# Patient Record
Sex: Male | Born: 1990 | Race: White | Hispanic: No | Marital: Married | Smoking: Former smoker
Health system: Southern US, Community
[De-identification: ages and names within clinical notes are randomized; demographics above are authoritative.]

## PROBLEM LIST (undated history)

## (undated) ENCOUNTER — Emergency Department: Admission: EM | Payer: PRIVATE HEALTH INSURANCE

## (undated) DIAGNOSIS — E291 Testicular hypofunction: Secondary | ICD-10-CM

## (undated) DIAGNOSIS — R7989 Other specified abnormal findings of blood chemistry: Secondary | ICD-10-CM

## (undated) HISTORY — DX: Testicular hypofunction: E29.1

## (undated) HISTORY — PX: HERNIA REPAIR: SHX51

---
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
## 2012-09-15 ENCOUNTER — Encounter (HOSPITAL_COMMUNITY): Payer: Self-pay | Admitting: *Deleted

## 2012-09-15 ENCOUNTER — Emergency Department (HOSPITAL_COMMUNITY): Payer: Worker's Compensation

## 2012-09-15 ENCOUNTER — Emergency Department (HOSPITAL_COMMUNITY)
Admission: EM | Admit: 2012-09-15 | Discharge: 2012-09-15 | Disposition: A | Discharge status: Home / Self Care | Payer: Worker's Compensation | Attending: Emergency Medicine | Admitting: Emergency Medicine

## 2012-09-15 DIAGNOSIS — Y990 Civilian activity done for income or pay: Secondary | ICD-10-CM | POA: Insufficient documentation

## 2012-09-15 DIAGNOSIS — S61409A Unspecified open wound of unspecified hand, initial encounter: Secondary | ICD-10-CM | POA: Insufficient documentation

## 2012-09-15 DIAGNOSIS — F17200 Nicotine dependence, unspecified, uncomplicated: Secondary | ICD-10-CM | POA: Insufficient documentation

## 2012-09-15 DIAGNOSIS — Z23 Encounter for immunization: Secondary | ICD-10-CM | POA: Insufficient documentation

## 2012-09-15 DIAGNOSIS — Y9269 Other specified industrial and construction area as the place of occurrence of the external cause: Secondary | ICD-10-CM | POA: Insufficient documentation

## 2012-09-15 DIAGNOSIS — S61411A Laceration without foreign body of right hand, initial encounter: Secondary | ICD-10-CM

## 2012-09-15 DIAGNOSIS — W241XXA Contact with transmission devices, not elsewhere classified, initial encounter: Secondary | ICD-10-CM | POA: Insufficient documentation

## 2012-09-15 DIAGNOSIS — S6720XA Crushing injury of unspecified hand, initial encounter: Secondary | ICD-10-CM | POA: Insufficient documentation

## 2012-09-15 MED ORDER — HYDROCODONE-ACETAMINOPHEN 5-325 MG PO TABS: 1.0000 | ORAL_TABLET | ORAL | Status: DC | PRN

## 2012-09-15 MED ORDER — TETANUS-DIPHTH-ACELL PERTUSSIS 5-2.5-18.5 LF-MCG/0.5 IM SUSP
0.5000 mL | Freq: Once | INTRAMUSCULAR | Status: AC
  Administered 2012-09-15: 0.5 mL via INTRAMUSCULAR
  Filled 2012-09-15: qty 0.5

## 2012-09-15 NOTE — ED Notes (Signed)
Pt at this time is now reporting numbness again in his thumb. Patient still able to move all 5 digits on affected hand. Cap refill less than 3 seconds.

## 2012-09-15 NOTE — ED Notes (Signed)
pt with crush injury to right hand. reports hand getting caught in machine and going around twice on hand. pt with laceration to right posterior aspect of hand between pointer and thumb. tendon noted but intact. pt is able to move all fingers. Pt however notes numbness to thumb and and half of proximal aspect of pointer and middle finger. CMS intact to nail bed and cap refill less than 3.

## 2012-09-15 NOTE — ED Provider Notes (Signed)
History     CSN: [id number]  Arrival date & time 09/15/12  0154   First MD Initiated Contact with Patient 09/15/12 [phone]      Chief Complaint  Patient presents with  . Laceration   HPI  History provided by the patient. 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 He has a crush and laceration injury to the right hand. Pain is moderate to severe. Worse with movements or pressure. Patient also has associated numbness in the first digit. He reports reduced range of motion secondary to pain. Denies any other injuries. Pt reports being current on tetanus within the last 5 years. He has not used treatment for his symptoms.    History reviewed. No pertinent past medical history.  History reviewed. No pertinent past surgical history.  History reviewed. No pertinent family history.  History  Substance Use Topics  . Smoking status: Current Every Day Smoker  . Smokeless tobacco: Not on file  . Alcohol Use: Yes      Review of Systems  Musculoskeletal:       Laceration and pain in rt hand  Neurological: Positive for numbness. Negative for weakness.    Allergies  Review of patient's allergies indicates no known allergies.  Home Medications  No current outpatient prescriptions on file.  BP 125/75  Pulse 93  Temp 98.9 F (37.2 C)  Resp 17  SpO2 98%  Physical Exam  Nursing note and vitals reviewed. Constitutional: He appears well-developed and well-nourished.  HENT:  Head: Normocephalic.  Cardiovascular: Normal rate and regular rhythm.   Pulmonary/Chest: Effort normal and breath sounds normal. No respiratory distress. He has no wheezes. He has no rales.  Musculoskeletal:       5 cm laceration to dorsal right hand on radial side.  Wound explored through full ROM.  Wound does reach level of 2nd metacarpal bone.  No obvious tendon involvement. Normal strength of first. Pt does report feeling slight numbness  in first digit. He is able to sense light touch to medial and lateral aspects of all fingers. Normal cap refill less than 2 seconds.  Neurological: He is alert.  Skin: Skin is warm.  Psychiatric: He has a normal mood and affect. His behavior is normal.    ED Course  Procedures   LACERATION REPAIR Performed by: Angus Seller Authorized by: Angus Seller Consent: Verbal consent obtained. Risks and benefits: risks, benefits and alternatives were discussed Consent given by: patient Patient identity confirmed: provided demographic data Prepped and Draped in normal sterile fashion Wound explored  Laceration Location: Dorsal right hand  Laceration Length: 5 cm  No Foreign Bodies seen or palpated  Anesthesia: local infiltration  Local anesthetic: lidocaine 2% without epinephrine  Anesthetic total: 6 ml  Irrigation method: syringe Amount of cleaning: extensive  Skin closure: Skin with 4-0 Nylon  Number of sutures: 7  Technique: Simple interrupted.  Patient tolerance: Patient tolerated the procedure well with no immediate complications.    Dg Hand Complete Right  09/15/2012  *RADIOLOGY REPORT*  Clinical Data: Laceration, pain, question very.  RIGHT HAND - COMPLETE 3+ VIEW  Comparison: None.  Findings: There is a radiopaque density along the radial margin of the proximal aspect second metacarpal.  Correlate with their symptoms if concerned for small fracture or radiopaque foreign body. A donor site is not definitively identified.  Otherwise, no displaced fracture or dislocation identified.  No aggressive osseous lesion.  IMPRESSION: Small radiopaque density along the proximal radial margin of the second metacarpal.  This may reflect a small fracture fragment or radiopaque foreign body.   Original Report Authenticated By: Waneta Martins, M.D.      1. Crush injury to hand   2. Laceration of hand, right       MDM  Pt seen and evaluated.  Pt in no acute distress.    X-rays without signs of fx. Pt given ortho hand referral to follow up tomorrow for close follow up.   Angus Seller, [place] 09/15/12 [phone]

## 2012-09-15 NOTE — ED Notes (Signed)
Pt was at work when he got his hand caught in a machine, reports laceration to right hand, posterior aspect, able to wiggle fingers. Bleeding controlled.

## 2012-10-03 NOTE — ED Provider Notes (Signed)
Medical screening examination/treatment/procedure(s) were conducted as a shared visit with non-physician practitioner(s) and myself.  I personally evaluated the patient during the encounter  Brandt Loosen, MD 10/03/12 [phone]

## 2013-11-26 DIAGNOSIS — Z0000 Encounter for general adult medical examination without abnormal findings: Secondary | ICD-10-CM | POA: Insufficient documentation

## 2014-04-18 ENCOUNTER — Emergency Department (HOSPITAL_COMMUNITY): Payer: BC Managed Care – PPO

## 2014-04-18 ENCOUNTER — Emergency Department (HOSPITAL_COMMUNITY)
Admission: EM | Admit: 2014-04-18 | Discharge: 2014-04-18 | Disposition: A | Discharge status: Home / Self Care | Payer: BC Managed Care – PPO | Attending: Emergency Medicine | Admitting: Emergency Medicine

## 2014-04-18 ENCOUNTER — Encounter (HOSPITAL_COMMUNITY): Payer: Self-pay | Admitting: Emergency Medicine

## 2014-04-18 DIAGNOSIS — N50819 Testicular pain, unspecified: Secondary | ICD-10-CM

## 2014-04-18 DIAGNOSIS — F17200 Nicotine dependence, unspecified, uncomplicated: Secondary | ICD-10-CM | POA: Insufficient documentation

## 2014-04-18 DIAGNOSIS — N509 Disorder of male genital organs, unspecified: Secondary | ICD-10-CM | POA: Insufficient documentation

## 2014-04-18 LAB — URINALYSIS, ROUTINE W REFLEX MICROSCOPIC
Bilirubin Urine: NEGATIVE
Glucose, UA: NEGATIVE mg/dL
Hgb urine dipstick: NEGATIVE
Ketones, ur: NEGATIVE mg/dL
LEUKOCYTES UA: NEGATIVE
NITRITE: NEGATIVE
PROTEIN: NEGATIVE mg/dL
SPECIFIC GRAVITY, URINE: 1.024 (ref 1.005–1.030)
UROBILINOGEN UA: 0.2 mg/dL (ref 0.0–1.0)
pH: 5.5 (ref 5.0–8.0)

## 2014-04-18 MED ORDER — AZITHROMYCIN 250 MG PO TABS
1000.0000 mg | ORAL_TABLET | Freq: Once | ORAL | Status: AC
  Administered 2014-04-18: 1000 mg via ORAL
  Filled 2014-04-18: qty 4

## 2014-04-18 MED ORDER — HYDROCODONE-ACETAMINOPHEN 5-325 MG PO TABS: 1.0000 | ORAL_TABLET | Freq: Four times a day (QID) | ORAL | Status: DC | PRN

## 2014-04-18 MED ORDER — LIDOCAINE HCL (PF) 1 % IJ SOLN
INTRAMUSCULAR | Status: AC
  Administered 2014-04-18: 0.9 mL
  Filled 2014-04-18: qty 5

## 2014-04-18 MED ORDER — DOXYCYCLINE HYCLATE 100 MG PO CAPS: 100.0000 mg | ORAL_CAPSULE | Freq: Two times a day (BID) | ORAL | Status: DC

## 2014-04-18 MED ORDER — CEFTRIAXONE SODIUM 250 MG IJ SOLR
250.0000 mg | Freq: Once | INTRAMUSCULAR | Status: AC
  Administered 2014-04-18: 250 mg via INTRAMUSCULAR
  Filled 2014-04-18: qty 250

## 2014-04-18 NOTE — ED Notes (Signed)
No answer in waiting room 

## 2014-04-18 NOTE — ED Notes (Signed)
C/o L testicle pain, and a little LLQ pain. denies swelling. Describes a "drawing upward". Reports minor injury sliding down a pole and hitting pole too hard with groin, occurred about a month ago, initial pain, then it got better, then gradually progressively worse. Uses androgel (not in last 2d). Admits to some change in stream, not able to get rid of all urine initially. Denies d/c, fever, nvd, lower back pain. No meds PTA.

## 2014-04-18 NOTE — ED Notes (Signed)
Patient is in ultrasound.

## 2014-04-18 NOTE — ED Notes (Signed)
Pt back from ultrasound.  Pt states 2 weeks of left testicle pain.  Intermittant mid to left abd pain, mild pain at this time.  Testicular pain 5/10 on pain scale.  No swelling.

## 2014-04-18 NOTE — ED Provider Notes (Signed)
CSN: [id number]     Arrival date & time 04/18/14  1955 History   This chart was scribed for Travis Horsemanobert Cniyah Sproull, PA by Evon Slackerrance Branch, ED Scribe. This patient was seen in room TR04C/TR04C and the patient's care was started at 9:21 PM.    Chief Complaint  Patient presents with  . Testicle Pain   The history is provided by the patient. No language interpreter was used.   HPI Comments: Travis Cervantes is a 23 y.o. male who presents to the Emergency Department complaining of constant  left testicle pain for onset 2 weeks ago. He states he has associated intermittent mild abdominal pain. He denies any dysuria, penile discharge, fever, chills, nausea, emesis, or diarrhea.   History reviewed. No pertinent past medical history. Past Surgical History  Procedure Laterality Date  . Hernia repair Right inguinal    distant   No family history on file. History  Substance Use Topics  . Smoking status: Current Every Day Smoker  . Smokeless tobacco: Not on file  . Alcohol Use: Yes    Review of Systems  Constitutional: Negative for fever and chills.  Gastrointestinal: Negative for nausea, vomiting and diarrhea.  Genitourinary: Negative for dysuria and discharge.      Allergies  Review of patient's allergies indicates no known allergies.  Home Medications   Prior to Admission medications   Medication Sig Start Date End Date Taking? Authorizing Provider  testosterone (ANDROGEL) 50 MG/5GM (1%) GEL Place 5 g onto the skin daily.   Yes Historical Provider, MD   Triage Vitals: BP 136/67  Pulse 63  Temp(Src) 98.7 F (37.1 C) (Oral)  Resp 16  Ht 6' (1.829 m)  Wt 220 lb (99.791 kg)  BMI 29.83 kg/m2  SpO2 100%  Physical Exam  Nursing note and vitals reviewed. Constitutional: He is oriented to person, place, and time. He appears well-developed and well-nourished. No distress.  HENT:  Head: Normocephalic and atraumatic.  Eyes: EOM are normal.  Neck: Neck supple. No tracheal deviation  present.  Cardiovascular: Normal rate.   Pulmonary/Chest: Effort normal. No respiratory distress.  Abdominal: He exhibits no distension and no mass. There is no tenderness. There is no rebound and no guarding.  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  Neurological: He is alert and oriented to person, place, and time.  Skin: Skin is warm and dry.  Psychiatric: He has a normal mood and affect. His behavior is normal.    ED Course  Procedures (including critical care time) DIAGNOSTIC STUDIES: Oxygen Saturation is 100% on RA, normal by my interpretation.    COORDINATION OF CARE: 10:18 PM discussed treatment with pt which includes antibiotics with pt at bedside. Pt understands and agrees    Labs Review Labs Reviewed  URINALYSIS, ROUTINE W REFLEX MICROSCOPIC    Imaging Review [place] Scrotum  04/18/2014   CLINICAL DATA:  Testicular pain  EXAM: SCROTAL ULTRASOUND  DOPPLER ULTRASOUND OF THE TESTICLES  TECHNIQUE: Complete ultrasound examination of the testicles, epididymis, and other scrotal structures was performed. Color and spectral Doppler ultrasound were also utilized to evaluate blood flow to the testicles.  COMPARISON:  None.  FINDINGS: Right testicle  Measurements: 4 x 2.4 x 2.7 cm. No mass  or microlithiasis visualized.  Left testicle  Measurements: 3.9 x 2.7 x 2.4 cm. No mass or microlithiasis visualized.  Right epididymis:  Normal in size and appearance.  Left epididymis:  Normal in size and appearance.  Hydrocele:  None visualized.  Varicocele:  There is a small left varicocele.  Pulsed Doppler interrogation of both testes demonstrates low resistance arterial and venous  waveforms bilaterally.  IMPRESSION: No testicular torsion.  No testicular mass.   Electronically Signed   By: Elige KoHetal  Patel   On: 04/18/2014 22:04   [place] Art/ven Flow Abd Pelv Doppler  04/18/2014   CLINICAL DATA:  Testicular pain  EXAM: SCROTAL ULTRASOUND  DOPPLER ULTRASOUND OF THE TESTICLES  TECHNIQUE: Complete ultrasound examination of the testicles, epididymis, and other scrotal structures was performed. Color and spectral Doppler ultrasound were also utilized to evaluate blood flow to the testicles.  COMPARISON:  None.  FINDINGS: Right testicle  Measurements: 4 x 2.4 x 2.7 cm. No mass or microlithiasis visualized.  Left testicle  Measurements: 3.9 x 2.7 x 2.4 cm. No mass or microlithiasis visualized.  Right epididymis:  Normal in size and appearance.  Left epididymis:  Normal in size and appearance.  Hydrocele:  None visualized.  Varicocele:  There is a small left varicocele.  Pulsed Doppler interrogation of both testes demonstrates low resistance arterial and venous waveforms bilaterally.  IMPRESSION: No testicular torsion.  No testicular mass.   Electronically Signed   By: Elige KoHetal  Patel   On: 04/18/2014 22:04     EKG Interpretation None      MDM   Final diagnoses:  Testicle pain    Patient with left testicle pain. Will order ultrasound to rule out torsion, though I suspect is unlikely, as the pain has been ongoing for the past month or so. Will also check GC.  Ultrasound was negative. Testicles nontender to palpation. No focal abdominal tenderness. Recommend urology followup. Will treat for STD  I personally performed the services described in this documentation, which was scribed in my presence. The recorded information has been reviewed and is accurate.       Travis Horsemanobert Laquisha Northcraft, PA-C 04/19/14 [phone]

## 2014-04-18 NOTE — Discharge Instructions (Signed)
Epididymitis  Epididymitis is a swelling (inflammation) of the epididymis. The epididymis is a cord-like structure along the back part of the testicle. Epididymitis is usually, but not always, caused by infection. This is usually a sudden problem beginning with chills, fever and pain behind the scrotum and in the testicle. There may be swelling and redness of the testicle.  DIAGNOSIS   Physical examination will reveal a tender, swollen epididymis. Sometimes, cultures are obtained from the urine or from prostate secretions to help find out if there is an infection or if the cause is a different problem. Sometimes, blood work is performed to see if your white blood cell count is elevated and if a germ (bacterial) or viral infection is present. Using this knowledge, an appropriate medicine which kills germs (antibiotic) can be chosen by your caregiver. A viral infection causing epididymitis will most often go away (resolve) without treatment.  HOME CARE INSTRUCTIONS   · Hot sitz baths for 20 minutes, 4 times per day, may help relieve pain.  · Only take over-the-counter or prescription medicines for pain, discomfort or fever as directed by your caregiver.  · Take all medicines, including antibiotics, as directed. Take the antibiotics for the full prescribed length of time even if you are feeling better.  · It is very important to keep all follow-up appointments.  SEEK IMMEDIATE MEDICAL CARE IF:   · You have a fever.  · You have pain not relieved with medicines.  · You have any worsening of your problems.  · Your pain seems to come and go.  · You develop pain, redness, and swelling in the scrotum and surrounding areas.  MAKE SURE YOU:   · Understand these instructions.  · Will watch your condition.  · Will get help right away if you are not doing well or get worse.  Document Released: 11/30/2000 Document Revised: 02/25/2012 Document Reviewed: 10/20/2009  Exit[employer]® Patient Information ©2014 [employer].

## 2014-04-19 LAB — GC/CHLAMYDIA PROBE AMP
CT PROBE, AMP APTIMA: NEGATIVE
GC PROBE AMP APTIMA: NEGATIVE

## 2014-04-19 NOTE — ED Provider Notes (Signed)
Medical screening examination/treatment/procedure(s) were performed by non-physician practitioner and as supervising physician I was immediately available for consultation/collaboration.   EKG Interpretation None        Danaria Larsen M Rafe Mackowski, DO 04/19/14 1353 

## 2014-05-26 ENCOUNTER — Encounter (HOSPITAL_COMMUNITY): Payer: BC Managed Care – PPO | Admitting: Anesthesiology

## 2014-05-26 ENCOUNTER — Emergency Department (HOSPITAL_BASED_OUTPATIENT_CLINIC_OR_DEPARTMENT_OTHER): Payer: BC Managed Care – PPO

## 2014-05-26 ENCOUNTER — Encounter (HOSPITAL_BASED_OUTPATIENT_CLINIC_OR_DEPARTMENT_OTHER): Payer: Self-pay | Admitting: Emergency Medicine

## 2014-05-26 ENCOUNTER — Inpatient Hospital Stay (HOSPITAL_COMMUNITY): Admission: AD | Admit: 2014-05-26 | Payer: BC Managed Care – PPO | Source: Ambulatory Visit | Admitting: General Surgery

## 2014-05-26 ENCOUNTER — Observation Stay (HOSPITAL_BASED_OUTPATIENT_CLINIC_OR_DEPARTMENT_OTHER)
Admission: EM | Admit: 2014-05-26 | Discharge: 2014-05-27 | Disposition: A | Discharge status: Home / Self Care | Payer: BC Managed Care – PPO | Attending: Surgery | Admitting: Surgery

## 2014-05-26 ENCOUNTER — Encounter (HOSPITAL_COMMUNITY)
Admission: EM | Disposition: A | Discharge status: Home / Self Care | Payer: Self-pay | Source: Home / Self Care | Attending: Emergency Medicine

## 2014-05-26 ENCOUNTER — Emergency Department (HOSPITAL_COMMUNITY): Payer: BC Managed Care – PPO | Admitting: Anesthesiology

## 2014-05-26 DIAGNOSIS — K3580 Unspecified acute appendicitis: Principal | ICD-10-CM | POA: Insufficient documentation

## 2014-05-26 DIAGNOSIS — K37 Unspecified appendicitis: Secondary | ICD-10-CM

## 2014-05-26 DIAGNOSIS — R1032 Left lower quadrant pain: Secondary | ICD-10-CM

## 2014-05-26 DIAGNOSIS — F17200 Nicotine dependence, unspecified, uncomplicated: Secondary | ICD-10-CM | POA: Insufficient documentation

## 2014-05-26 DIAGNOSIS — R1031 Right lower quadrant pain: Secondary | ICD-10-CM | POA: Insufficient documentation

## 2014-05-26 HISTORY — PX: LAPAROSCOPIC APPENDECTOMY: SHX408

## 2014-05-26 HISTORY — DX: Other specified abnormal findings of blood chemistry: R79.89

## 2014-05-26 LAB — COMPREHENSIVE METABOLIC PANEL
ALK PHOS: 72 U/L (ref 39–117)
ALT: 29 U/L (ref 0–53)
AST: 24 U/L (ref 0–37)
Albumin: 4.9 g/dL (ref 3.5–5.2)
BILIRUBIN TOTAL: 0.6 mg/dL (ref 0.3–1.2)
BUN: 18 mg/dL (ref 6–23)
CHLORIDE: 102 meq/L (ref 96–112)
CO2: 24 meq/L (ref 19–32)
CREATININE: 0.9 mg/dL (ref 0.50–1.35)
Calcium: 10.3 mg/dL (ref 8.4–10.5)
GFR calc Af Amer: >90 mL/min (ref >90)
Glucose, Bld: 101 mg/dL — ABNORMAL HIGH (ref 70–99)
POTASSIUM: 4.3 meq/L (ref 3.7–5.3)
Sodium: 140 mEq/L (ref 137–147)
Total Protein: 8.1 g/dL (ref 6.0–8.3)

## 2014-05-26 LAB — CBC WITH DIFFERENTIAL/PLATELET
BASOS ABS: 0 10*3/uL (ref 0.0–0.1)
Basophils Relative: 0 % (ref 0–1)
Eosinophils Absolute: 0.3 10*3/uL (ref 0.0–0.7)
Eosinophils Relative: 3 % (ref 0–5)
HCT: 45.4 % (ref 39.0–52.0)
Hemoglobin: 16 g/dL (ref 13.0–17.0)
LYMPHS PCT: 15 % (ref 12–46)
Lymphs Abs: 1.9 10*3/uL (ref 0.7–4.0)
MCH: 30.9 pg (ref 26.0–34.0)
MCHC: 35.2 g/dL (ref 30.0–36.0)
MCV: 87.8 fL (ref 78.0–100.0)
MONO ABS: 1.1 10*3/uL — AB (ref 0.1–1.0)
Monocytes Relative: 9 % (ref 3–12)
NEUTROS ABS: 9 10*3/uL — AB (ref 1.7–7.7)
NEUTROS PCT: 73 % (ref 43–77)
Platelets: 179 10*3/uL (ref 150–400)
RBC: 5.17 MIL/uL (ref 4.22–5.81)
RDW: 12.6 % (ref 11.5–15.5)
WBC: 12.3 10*3/uL — ABNORMAL HIGH (ref 4.0–10.5)

## 2014-05-26 LAB — URINALYSIS, ROUTINE W REFLEX MICROSCOPIC
BILIRUBIN URINE: NEGATIVE
Glucose, UA: NEGATIVE mg/dL
HGB URINE DIPSTICK: NEGATIVE
KETONES UR: NEGATIVE mg/dL
Leukocytes, UA: NEGATIVE
NITRITE: NEGATIVE
Protein, ur: NEGATIVE mg/dL
SPECIFIC GRAVITY, URINE: 1.024 (ref 1.005–1.030)
UROBILINOGEN UA: 0.2 mg/dL (ref 0.0–1.0)
pH: 6 (ref 5.0–8.0)

## 2014-05-26 SURGERY — APPENDECTOMY, LAPAROSCOPIC
Anesthesia: General | Site: Abdomen

## 2014-05-26 MED ORDER — PROPOFOL 10 MG/ML IV BOLUS
INTRAVENOUS | Status: DC | PRN
  Administered 2014-05-26: 200 mg via INTRAVENOUS

## 2014-05-26 MED ORDER — MORPHINE SULFATE 2 MG/ML IJ SOLN
2.0000 mg | INTRAMUSCULAR | Status: DC | PRN
  Administered 2014-05-26: 2 mg via INTRAVENOUS
  Filled 2014-05-26: qty 1

## 2014-05-26 MED ORDER — FENTANYL CITRATE 0.05 MG/ML IJ SOLN
INTRAMUSCULAR | Status: DC | PRN
  Administered 2014-05-26: 50 ug via INTRAVENOUS
  Administered 2014-05-26 (×2): 100 ug via INTRAVENOUS

## 2014-05-26 MED ORDER — FENTANYL CITRATE 0.05 MG/ML IJ SOLN
50.0000 ug | Freq: Once | INTRAMUSCULAR | Status: AC
  Administered 2014-05-26: 50 ug via INTRAVENOUS
  Filled 2014-05-26: qty 2

## 2014-05-26 MED ORDER — KCL-LACTATED RINGERS-D5W 20 MEQ/L IV SOLN
INTRAVENOUS | Status: DC
  Administered 2014-05-26: 20:00:00 via INTRAVENOUS
  Filled 2014-05-26 (×2): qty 1000

## 2014-05-26 MED ORDER — MIDAZOLAM HCL 5 MG/5ML IJ SOLN
INTRAMUSCULAR | Status: DC | PRN
  Administered 2014-05-26 (×2): 1 mg via INTRAVENOUS

## 2014-05-26 MED ORDER — BUPIVACAINE HCL (PF) 0.5 % IJ SOLN
INTRAMUSCULAR | Status: DC | PRN
  Administered 2014-05-26: 13 mL

## 2014-05-26 MED ORDER — OXYCODONE-ACETAMINOPHEN 5-325 MG PO TABS
1.0000 | ORAL_TABLET | ORAL | Status: DC | PRN
  Administered 2014-05-27 (×3): 1 via ORAL
  Filled 2014-05-26 (×2): qty 1
  Filled 2014-05-26: qty 2

## 2014-05-26 MED ORDER — IOHEXOL 300 MG/ML  SOLN
100.0000 mL | Freq: Once | INTRAMUSCULAR | Status: AC | PRN
Start: 2014-05-26 — End: 2014-05-26
  Administered 2014-05-26: 100 mL via INTRAVENOUS

## 2014-05-26 MED ORDER — LACTATED RINGERS IV SOLN
INTRAVENOUS | Status: DC | PRN
  Administered 2014-05-26 (×2): via INTRAVENOUS

## 2014-05-26 MED ORDER — LACTATED RINGERS IV SOLN
INTRAVENOUS | Status: DC
  Administered 2014-05-26: 1000 mL via INTRAVENOUS

## 2014-05-26 MED ORDER — SODIUM CHLORIDE 0.9 % IV BOLUS (SEPSIS)
1000.0000 mL | Freq: Once | INTRAVENOUS | Status: AC
  Administered 2014-05-26: 1000 mL via INTRAVENOUS

## 2014-05-26 MED ORDER — PIPERACILLIN-TAZOBACTAM 3.375 G IVPB
3.3750 g | Freq: Once | INTRAVENOUS | Status: AC
  Administered 2014-05-26: 3.375 g via INTRAVENOUS
  Filled 2014-05-26: qty 50

## 2014-05-26 MED ORDER — 0.9 % SODIUM CHLORIDE (POUR BTL) OPTIME
TOPICAL | Status: DC | PRN
  Administered 2014-05-26: 1000 mL

## 2014-05-26 MED ORDER — MORPHINE SULFATE 4 MG/ML IJ SOLN: 4.0000 mg | Freq: Once | INTRAMUSCULAR | Status: DC

## 2014-05-26 MED ORDER — CISATRACURIUM BESYLATE (PF) 10 MG/5ML IV SOLN
INTRAVENOUS | Status: DC | PRN
  Administered 2014-05-26: 10 mg via INTRAVENOUS
  Administered 2014-05-26: 2 mg via INTRAVENOUS

## 2014-05-26 MED ORDER — NEOSTIGMINE METHYLSULFATE 10 MG/10ML IV SOLN
INTRAVENOUS | Status: DC | PRN
  Administered 2014-05-26: 5 mg via INTRAVENOUS

## 2014-05-26 MED ORDER — ONDANSETRON HCL 4 MG PO TABS: 4.0000 mg | ORAL_TABLET | Freq: Four times a day (QID) | ORAL | Status: DC | PRN

## 2014-05-26 MED ORDER — ONDANSETRON HCL 4 MG/2ML IJ SOLN: 4.0000 mg | Freq: Once | INTRAMUSCULAR | Status: DC

## 2014-05-26 MED ORDER — HYDROMORPHONE HCL PF 2 MG/ML IJ SOLN
INTRAMUSCULAR | Status: AC
  Filled 2014-05-26: qty 1

## 2014-05-26 MED ORDER — GLYCOPYRROLATE 0.2 MG/ML IJ SOLN
INTRAMUSCULAR | Status: DC | PRN
  Administered 2014-05-26: 0.6 mg via INTRAVENOUS

## 2014-05-26 MED ORDER — DEXAMETHASONE SODIUM PHOSPHATE 4 MG/ML IJ SOLN
INTRAMUSCULAR | Status: DC | PRN
  Administered 2014-05-26: 10 mg via INTRAVENOUS

## 2014-05-26 MED ORDER — PIPERACILLIN-TAZOBACTAM 3.375 G IVPB
3.3750 g | Freq: Three times a day (TID) | INTRAVENOUS | Status: AC
  Administered 2014-05-26: 3.375 g via INTRAVENOUS
  Filled 2014-05-26: qty 50

## 2014-05-26 MED ORDER — LACTATED RINGERS IV SOLN
INTRAVENOUS | Status: DC | PRN
  Administered 2014-05-26: 1000 mL

## 2014-05-26 MED ORDER — SUCCINYLCHOLINE CHLORIDE 20 MG/ML IJ SOLN
INTRAMUSCULAR | Status: DC | PRN
  Administered 2014-05-26: 150 mg via INTRAVENOUS

## 2014-05-26 MED ORDER — KETAMINE HCL 10 MG/ML IJ SOLN
INTRAMUSCULAR | Status: DC | PRN
  Administered 2014-05-26: 10 mg via INTRAVENOUS
  Administered 2014-05-26: 15 mg via INTRAVENOUS

## 2014-05-26 MED ORDER — LIDOCAINE HCL (CARDIAC) 20 MG/ML IV SOLN
INTRAVENOUS | Status: DC | PRN
  Administered 2014-05-26: 30 mg via INTRAVENOUS

## 2014-05-26 MED ORDER — ONDANSETRON HCL 4 MG/2ML IJ SOLN
INTRAMUSCULAR | Status: DC | PRN
  Administered 2014-05-26 (×2): 2 mg via INTRAVENOUS

## 2014-05-26 MED ORDER — IOHEXOL 300 MG/ML  SOLN
50.0000 mL | Freq: Once | INTRAMUSCULAR | Status: AC | PRN
  Administered 2014-05-26: 50 mL via ORAL

## 2014-05-26 MED ORDER — BUPIVACAINE HCL (PF) 0.5 % IJ SOLN
INTRAMUSCULAR | Status: AC
  Filled 2014-05-26: qty 30

## 2014-05-26 MED ORDER — ONDANSETRON HCL 4 MG/2ML IJ SOLN: 4.0000 mg | INTRAMUSCULAR | Status: DC | PRN

## 2014-05-26 MED ORDER — HYDROMORPHONE HCL PF 1 MG/ML IJ SOLN
INTRAMUSCULAR | Status: DC | PRN
  Administered 2014-05-26 (×2): 0.5 mg via INTRAVENOUS
  Administered 2014-05-26: 1 mg via INTRAVENOUS

## 2014-05-26 SURGICAL SUPPLY — 45 items
APL SKNCLS STERI-STRIP NONHPOA (GAUZE/BANDAGES/DRESSINGS) ×1
APPLIER CLIP 5 13 M/L LIGAMAX5 (MISCELLANEOUS)
APPLIER CLIP ROT 10 11.4 M/L (STAPLE)
APR CLP MED LRG 11.4X10 (STAPLE)
APR CLP MED LRG 5 ANG JAW (MISCELLANEOUS)
BAG SPEC RTRVL LRG 6X4 10 (ENDOMECHANICALS) ×1
BENZOIN TINCTURE PRP APPL 2/3 (GAUZE/BANDAGES/DRESSINGS) ×2 IMPLANT
CANISTER SUCTION 2500CC (MISCELLANEOUS) ×1 IMPLANT
CHLORAPREP W/TINT 26ML (MISCELLANEOUS) ×2 IMPLANT
CLIP APPLIE 5 13 M/L LIGAMAX5 (MISCELLANEOUS) IMPLANT
CLIP APPLIE ROT 10 11.4 M/L (STAPLE) IMPLANT
CUTTER FLEX LINEAR 45M (STAPLE) ×1 IMPLANT
DECANTER SPIKE VIAL GLASS SM (MISCELLANEOUS) ×1 IMPLANT
DISSECTOR BLUNT TIP ENDO 5MM (MISCELLANEOUS) ×1 IMPLANT
DRAIN CHANNEL 19F RND (DRAIN) IMPLANT
DRAPE LAPAROSCOPIC ABDOMINAL (DRAPES) ×2 IMPLANT
DRSG TEGADERM 2-3/8X2-3/4 SM (GAUZE/BANDAGES/DRESSINGS) ×4 IMPLANT
ELECT REM PT RETURN 9FT ADLT (ELECTROSURGICAL) ×2
ELECTRODE REM PT RTRN 9FT ADLT (ELECTROSURGICAL) ×1 IMPLANT
ENDOLOOP SUT PDS II  0 18 (SUTURE)
ENDOLOOP SUT PDS II 0 18 (SUTURE) IMPLANT
EVACUATOR SILICONE 100CC (DRAIN) IMPLANT
GLOVE ECLIPSE 8.0 STRL XLNG CF (GLOVE) ×2 IMPLANT
GLOVE INDICATOR 8.0 STRL GRN (GLOVE) ×2 IMPLANT
GOWN STRL REUS W/TWL XL LVL3 (GOWN DISPOSABLE) ×4 IMPLANT
KIT BASIN OR (CUSTOM PROCEDURE TRAY) ×2 IMPLANT
POUCH SPECIMEN RETRIEVAL 10MM (ENDOMECHANICALS) ×2 IMPLANT
RELOAD 45 VASCULAR/THIN (ENDOMECHANICALS) IMPLANT
RELOAD STAPLE 45 2.5 WHT GRN (ENDOMECHANICALS) IMPLANT
RELOAD STAPLE 45 3.5 BLU ETS (ENDOMECHANICALS) IMPLANT
RELOAD STAPLE TA45 3.5 REG BLU (ENDOMECHANICALS) ×2 IMPLANT
SET IRRIG TUBING LAPAROSCOPIC (IRRIGATION / IRRIGATOR) ×2 IMPLANT
SHEARS HARMONIC ACE PLUS 36CM (ENDOMECHANICALS) ×2 IMPLANT
SLEEVE XCEL OPT CAN 5 100 (ENDOMECHANICALS) ×2 IMPLANT
SOLUTION ANTI FOG 6CC (MISCELLANEOUS) ×2 IMPLANT
STRIP CLOSURE SKIN 1/2X4 (GAUZE/BANDAGES/DRESSINGS) ×2 IMPLANT
SUT ETHILON 3 0 PS 1 (SUTURE) IMPLANT
SUT MNCRL AB 4-0 PS2 18 (SUTURE) ×2 IMPLANT
TOWEL OR 17X26 10 PK STRL BLUE (TOWEL DISPOSABLE) ×2 IMPLANT
TOWEL OR NON WOVEN STRL DISP B (DISPOSABLE) ×2 IMPLANT
TRAY FOLEY CATH 14FRSI W/METER (CATHETERS) ×2 IMPLANT
TRAY LAP CHOLE (CUSTOM PROCEDURE TRAY) ×2 IMPLANT
TROCAR BLADELESS OPT 5 100 (ENDOMECHANICALS) ×2 IMPLANT
TROCAR XCEL BLUNT TIP 100MML (ENDOMECHANICALS) ×2 IMPLANT
TUBING INSUFFLATION 10FT LAP (TUBING) ×2 IMPLANT

## 2014-05-26 NOTE — H&P (Signed)
Travis Cervantes 1991/06/08  [id number].   Chief Complaint/Reason for Consult: acute appendicitis  HPI: This is a 23 yo white male who developed constipation on Friday.  This is unusual for him.  On Monday he developed LLQ abdominal pain.  No nausea or vomiting.  No fevers.  Pain persisted yesterday and he took a laxative and suppository.  He still didn't have a BM.  His pain continued into this morning.  He went to [facility] where he had a CT scan that revealed acute appendicitis.  He was transferred to [facility] for surgical intervention.  ROS: please see HPI, otherwise all other systems have been reviewed and are negative  No family history on file.  History reviewed. No pertinent past medical history.  Past Surgical History  Procedure Laterality Date  . Hernia repair Right inguinal    distant    Social History:  reports that he has been smoking.  He does not have any smokeless tobacco history on file. He reports that he drinks alcohol. His drug history is not on file.  Allergies: No Known Allergies   (Not in a hospital admission)  Blood pressure 133/82, pulse 88, temperature 97.9 F (36.6 C), temperature source Oral, resp. rate 18, height 6' (1.829 m), weight 210 lb (95.255 kg), SpO2 98.00%. Physical Exam: General: pleasant, WD, WN white male who is laying in bed in NAD HEENT: head is normocephalic, atraumatic.  Sclera are noninjected.  PERRL.  Ears and nose without any masses or lesions.  Mouth is pink and moist Heart: regular, rate, and rhythm.  Normal s1,s2. No obvious murmurs, gallops, or rubs noted.  Palpable radial and pedal pulses bilaterally Lungs: CTAB, no wheezes, rhonchi, or rales noted.  Respiratory effort nonlabored Abd: soft, tender in RLQ, ND, +BS, no masses, hernias, or organomegaly MS: all 4 extremities are symmetrical with no cyanosis, clubbing, or edema. Skin: warm and dry with no masses, lesions, or rashes Psych: A&Ox3 with an appropriate affect.    Results for orders  placed during the hospital encounter of 05/26/14 (from the past 48 hour(s))  URINALYSIS, ROUTINE W REFLEX MICROSCOPIC     Status: None   Collection Time    05/26/14  8:38 AM      Result Value Ref Range   Color, Urine YELLOW  YELLOW   APPearance CLEAR  CLEAR   Specific Gravity, Urine 1.024  1.005 - 1.030   pH 6.0  5.0 - 8.0   Glucose, UA NEGATIVE  NEGATIVE mg/dL   Hgb urine dipstick NEGATIVE  NEGATIVE   Bilirubin Urine NEGATIVE  NEGATIVE   Ketones, ur NEGATIVE  NEGATIVE mg/dL   Protein, ur NEGATIVE  NEGATIVE mg/dL   Urobilinogen, UA 0.2  0.0 - 1.0 mg/dL   Nitrite NEGATIVE  NEGATIVE   Leukocytes, UA NEGATIVE  NEGATIVE   Comment: MICROSCOPIC NOT DONE ON URINES WITH NEGATIVE PROTEIN, BLOOD, LEUKOCYTES, NITRITE, OR GLUCOSE <1000 mg/dL.  CBC WITH DIFFERENTIAL     Status: Abnormal   Collection Time    05/26/14  9:00 AM      Result Value Ref Range   WBC 12.3 (*) 4.0 - 10.5 K/uL   RBC 5.17  4.22 - 5.81 MIL/uL   Hemoglobin 16.0  13.0 - 17.0 g/dL   HCT 45.4  39.0 - 52.0 %   MCV 87.8  78.0 - 100.0 fL   MCH 30.9  26.0 - 34.0 pg   MCHC 35.2  30.0 - 36.0 g/dL   RDW 12.6  11.5 - 15.5 %  Platelets 179  150 - 400 K/uL   Neutrophils Relative % 73  43 - 77 %   Neutro Abs 9.0 (*) 1.7 - 7.7 K/uL   Lymphocytes Relative 15  12 - 46 %   Lymphs Abs 1.9  0.7 - 4.0 K/uL   Monocytes Relative 9  3 - 12 %   Monocytes Absolute 1.1 (*) 0.1 - 1.0 K/uL   Eosinophils Relative 3  0 - 5 %   Eosinophils Absolute 0.3  0.0 - 0.7 K/uL   Basophils Relative 0  0 - 1 %   Basophils Absolute 0.0  0.0 - 0.1 K/uL  COMPREHENSIVE METABOLIC PANEL     Status: Abnormal   Collection Time    05/26/14  9:00 AM      Result Value Ref Range   Sodium 140  137 - 147 mEq/L   Potassium 4.3  3.7 - 5.3 mEq/L   Chloride 102  96 - 112 mEq/L   CO2 24  19 - 32 mEq/L   Glucose, Bld 101 (*) 70 - 99 mg/dL   BUN 18  6 - 23 mg/dL   Creatinine, Ser 0.90  0.50 - 1.35 mg/dL   Calcium 10.3  8.4 - 10.5 mg/dL   Total Protein 8.1  6.0 - 8.3  g/dL   Albumin 4.9  3.5 - 5.2 g/dL   AST 24  0 - 37 U/L   ALT 29  0 - 53 U/L   Alkaline Phosphatase 72  39 - 117 U/L   Total Bilirubin 0.6  0.3 - 1.2 mg/dL   GFR calc non Af Amer >90  >90 mL/min   GFR calc Af Amer >90  >90 mL/min   Comment: (NOTE)     The eGFR has been calculated using the CKD EPI equation.     This calculation has not been validated in all clinical situations.     eGFR's persistently <90 mL/min signify possible Chronic Kidney     Disease.   Ct Abdomen Pelvis W Contrast  05/26/2014   CLINICAL DATA:  Right lower quadrant tenderness  EXAM: CT ABDOMEN AND PELVIS WITH CONTRAST  TECHNIQUE: Multidetector CT imaging of the abdomen and pelvis was performed using the standard protocol following bolus administration of intravenous contrast.  CONTRAST:  29m OMNIPAQUE IOHEXOL 300 MG/ML SOLN, 1073mOMNIPAQUE IOHEXOL 300 MG/ML SOLN  COMPARISON:  None.  FINDINGS: Lung bases are free of acute infiltrate or sizable effusion.  The liver, gallbladder, spleen, adrenal glands and pancreas are all normal in their CT appearance. Kidneys are well visualized bilaterally and demonstrate a normal enhancement pattern. No renal calculi or obstructive changes are seen.  The appendix is well visualized and mildly dilated with an appendicolith within. Considerable periappendiceal inflammatory changes are noted. The bladder is well distended. No free pelvic fluid or pelvic mass lesion is noted. The osseous structures are within normal limits.  IMPRESSION: Mildly dilated appendix with periappendiceal inflammatory change and an appendicolith within. These changes are consistent with early appendicitis. No perforation is noted.  No other focal abnormality is seen.   Electronically Signed   By: MaInez Catalina.D.   On: 05/26/2014 10:17       Assessment/Plan 1. Acute appendicitis  Plan: 1. We will admit the patient and take him to the operating room for an appendectomy.  This has been discussed with the patient  who is agreeable.  He has already received a dose of Zosyn.  Cont NPO and proceed to OR.  Cheyan Frees  E 05/26/2014, 10:49 AM Pager: [phone]

## 2014-05-26 NOTE — Op Note (Signed)
Operative Note  JAXSIN FECHTER male 23 y.o. 05/26/2014  PREOPERATIVE DX:  Acute appendicitis  POSTOPERATIVE DX:  Same  PROCEDURE:  Laparoscopic appendectomy         Surgeon: Adolph Pollack   Assistants: None  Anesthesia: General endotracheal anesthesia  Indications: This is a 23 year old male who began having some abdominal pain approximately 3 days ago.  The pain worsened and radiated to the lower abdominal area. He presented to the [facility] for evaluation.  He was found to have acute appendicitis. He was transferred here for definitive treatment.    Procedure Detail:  He was brought to the operating room placed supine on the operating table and general anesthetic was given. A Foley catheter was inserted. The hair on the abdominal wall was clipped. The abdominal wall was widely sterilely prepped and draped.  Marcaine was infiltrated in the subumbilical region. A small subumbilical incision was made through the skin and subcutaneous tissue until the midline fascia was identified. A small incision was made in the midline fascia. The peritoneal cavity was entered under direct vision. A pursestring suture of 0 Vicryl placed around the fascial edges. A Hassan trocar was introduced into the peritoneal cavity and pneumoperitoneum was created by insufflation of CO2 gas.  Next laparoscopic introduced and there is no evidence of underlying organ injury bleeding. The 5 mm trochars placed in the left lower quadrant. A 5 mm trochars placed in the right upper quadrant. He had adhesions between his distal ileum and the lateral sidewall which were taken down sharply. The appendix was then identified and was indurated and acutely inflamed.  There was no evidence of perforation or abscess. The appendix had attachments to the right gutter and these were divided with the harmonic scalpel. The mesoappendix was then divided down to the base of the cecum. The appendix was then amputated off the cecum,  with a cuff of cecum, with the laparoscopic linear cutting stapler. The staple line was solid without evidence of bleeding or leak. The appendix was then put in a retrieval bag and removed through the subumbilical incision.  The right lower quadrant area was copiously irrigated out with saline.  The fluid was evacuated. The abdominal cavity inspection was performed and there is no evidence of bleeding or organ injury.  The subumbilical trocar was removed and the fascial defect closed under laparoscopic vision by tying down the pursestring suture. The remaining trocars were removed and the pneumoperitoneum was released.  The skin incisions were closed with 4-0 Monocryl subcuticular stitches. Steri-Strips and sterile dressings were applied.  He tolerated the procedure well without any apparent complications and was taken to recovery in satisfactory condition.    Findings:  Acute nonperforated appendicitis  Estimated Blood Loss:  150 ml         Drains: none        Blood Given: none          Specimens: Appendix        Complications:  * No complications entered in OR log *         Disposition: PACU - hemodynamically stable.         Condition: stable

## 2014-05-26 NOTE — Anesthesia Postprocedure Evaluation (Signed)
Anesthesia Post Note  Patient: Travis Cervantes  Procedure(s) Performed: Procedure(s) (LRB): APPENDECTOMY LAPAROSCOPIC (N/A)  Anesthesia type: General  Patient location: PACU  Post pain: Pain level controlled  Post assessment: Post-op Vital signs reviewed  Last Vitals: BP 125/88  Pulse 61  Temp(Src) 37.1 C (Oral)  Resp 16  Ht 6' (1.829 m)  Wt 209 lb 3.2 oz (94.892 kg)  BMI 28.37 kg/m2  SpO2 100%  Post vital signs: Reviewed  Level of consciousness: sedated  Complications: No apparent anesthesia complications

## 2014-05-26 NOTE — ED Notes (Signed)
Pt c/o constipation and middle abdominal pain since Monday. Pt reports last normal BM was last Friday. Denies N/V.

## 2014-05-26 NOTE — Anesthesia Preprocedure Evaluation (Signed)
Anesthesia Evaluation  Patient identified by MRN, date of birth, ID band Patient awake    Reviewed: Allergy & Precautions, H&P , NPO status , Patient's Chart, lab work & pertinent test results  Airway Mallampati: II TM Distance: >3 FB Neck ROM: Full    Dental  (+) Dental Advisory Given   Pulmonary Current Smoker,  breath sounds clear to auscultation        Cardiovascular negative cardio ROS  Rhythm:Regular Rate:Normal     Neuro/Psych negative neurological ROS  negative psych ROS   GI/Hepatic negative GI ROS, Neg liver ROS,   Endo/Other  negative endocrine ROS  Renal/GU negative Renal ROS  negative genitourinary   Musculoskeletal negative musculoskeletal ROS (+)   Abdominal   Peds negative pediatric ROS (+)  Hematology negative hematology ROS (+)   Anesthesia Other Findings   Reproductive/Obstetrics negative OB ROS                           Anesthesia Physical Anesthesia Plan  ASA: II  Anesthesia Plan: General   Post-op Pain Management:    Induction: Intravenous and Rapid sequence  Airway Management Planned: Oral ETT  Additional Equipment:   Intra-op Plan:   Post-operative Plan: Extubation in OR  Informed Consent: I have reviewed the patients History and Physical, chart, labs and discussed the procedure including the risks, benefits and alternatives for the proposed anesthesia with the patient or authorized representative who has indicated his/her understanding and acceptance.   Dental advisory given  Plan Discussed with: CRNA  Anesthesia Plan Comments:         Anesthesia Quick Evaluation

## 2014-05-26 NOTE — ED Notes (Signed)
MD at bedside. 

## 2014-05-26 NOTE — ED Provider Notes (Addendum)
CSN: [id number]     Arrival date & time 05/26/14  [id number] History   First MD Initiated Contact with Patient 05/26/14 [phone]     Chief Complaint  Patient presents with  . Abdominal Pain     (Consider location/radiation/quality/duration/timing/severity/associated sxs/prior Treatment) Patient is a 23 y.o. male presenting with abdominal pain. The history is provided by the patient.  Abdominal Pain Pain location:  Generalized Pain quality: aching, cramping, sharp and shooting   Pain radiates to:  Does not radiate Pain severity:  Severe Onset quality:  Gradual Duration:  3 days Timing:  Constant Progression:  Worsening Chronicity:  New Context: awakening from sleep   Context: not alcohol use, not diet changes, not eating, not sick contacts, not suspicious food intake and not trauma   Relieved by:  Nothing Exacerbated by: lying down. Ineffective treatments: stool softeners. Associated symptoms: anorexia and constipation   Associated symptoms: no chest pain, no chills, no cough, no diarrhea, no dysuria, no hematuria, no nausea and no vomiting   Risk factors: no alcohol abuse, has not had multiple surgeries and no NSAID use     History reviewed. No pertinent past medical history. Past Surgical History  Procedure Laterality Date  . Hernia repair Right inguinal    distant   No family history on file. History  Substance Use Topics  . Smoking status: Current Every Day Smoker  . Smokeless tobacco: Not on file  . Alcohol Use: Yes    Review of Systems  Constitutional: Negative for chills.  Respiratory: Negative for cough.   Cardiovascular: Negative for chest pain.  Gastrointestinal: Positive for abdominal pain, constipation and anorexia. Negative for nausea, vomiting and diarrhea.  Genitourinary: Negative for dysuria and hematuria.  All other systems reviewed and are negative.     Allergies  Review of patient's allergies indicates no known allergies.  Home Medications   Prior  to Admission medications   Medication Sig Start Date End Date Taking? Authorizing Provider  doxycycline (VIBRAMYCIN) 100 MG capsule Take 1 capsule (100 mg total) by mouth 2 (two) times daily. 04/18/14   Roxy Horsemanobert Browning, PA-C  HYDROcodone-acetaminophen (NORCO/VICODIN) 5-325 MG per tablet Take 1 tablet by mouth every 6 (six) hours as needed. 04/18/14   Roxy Horsemanobert Browning, PA-C  testosterone (ANDROGEL) 50 MG/5GM (1%) GEL Place 5 g onto the skin daily.    Historical Provider, MD   BP 133/82  Pulse 88  Temp(Src) 97.9 F (36.6 C) (Oral)  Resp 18  Ht 6' (1.829 m)  Wt 210 lb (95.255 kg)  BMI 28.47 kg/m2  SpO2 98% Physical Exam  Nursing note and vitals reviewed. Constitutional: He is oriented to person, place, and time. He appears well-developed and well-nourished. He appears distressed.  Appears uncomfortable standing and swaying in the room  HENT:  Head: Normocephalic and atraumatic.  Mouth/Throat: Oropharynx is clear and moist.  Eyes: Conjunctivae and EOM are normal. Pupils are equal, round, and reactive to light.  Neck: Normal range of motion. Neck supple.  Cardiovascular: Normal rate, regular rhythm and intact distal pulses.   No murmur heard. Pulmonary/Chest: Effort normal and breath sounds normal. No respiratory distress. He has no wheezes. He has no rales.  Abdominal: Soft. Bowel sounds are normal. He exhibits no distension. There is tenderness in the right lower quadrant. There is guarding. There is no rebound.  Mild left flank pain  Musculoskeletal: Normal range of motion. He exhibits no edema and no tenderness.  Neurological: He is alert and oriented to person, place, and time.  Skin: Skin is warm and dry. No rash noted. No erythema.  Psychiatric: He has a normal mood and affect. His behavior is normal.    ED Course  Procedures (including critical care time) Labs Review Labs Reviewed  CBC WITH DIFFERENTIAL - Abnormal; Notable for the following:    WBC 12.3 (*)    Neutro Abs 9.0  (*)    Monocytes Absolute 1.1 (*)    All other components within normal limits  URINALYSIS, ROUTINE W REFLEX MICROSCOPIC  COMPREHENSIVE METABOLIC PANEL    Imaging Review Ct Abdomen Pelvis W Contrast  05/26/2014   CLINICAL DATA:  Right lower quadrant tenderness  EXAM: CT ABDOMEN AND PELVIS WITH CONTRAST  TECHNIQUE: Multidetector CT imaging of the abdomen and pelvis was performed using the standard protocol following bolus administration of intravenous contrast.  CONTRAST:  54mL OMNIPAQUE IOHEXOL 300 MG/ML SOLN, OMNIPAQUE IOHEXOL 300 MG/ML SOLN  COMPARISON:  None.  FINDINGS: Lung bases are free of acute infiltrate or sizable effusion.  The liver, gallbladder, spleen, adrenal glands and pancreas are all normal in their CT appearance. Kidneys are well visualized bilaterally and demonstrate a normal enhancement pattern. No renal calculi or obstructive changes are seen.  The appendix is well visualized and mildly dilated with an appendicolith within. Considerable periappendiceal inflammatory changes are noted. The bladder is well distended. No free pelvic fluid or pelvic mass lesion is noted. The osseous structures are within normal limits.  IMPRESSION: Mildly dilated appendix with periappendiceal inflammatory change and an appendicolith within. These changes are consistent with early appendicitis. No perforation is noted.  No other focal abnormality is seen.   Electronically Signed   By: Alcide Clever M.D.   On: 05/26/2014 10:17     EKG Interpretation None      MDM   Final diagnoses:  Appendicitis    Patient presenting with 3 days of abdominal pain that he states is diffuse but more pronounced in the right lower quadrant. He denies fever, nausea or vomiting but states his last normal bowel movement was on Friday. He has tried multiple stool softeners and small bowel movements but and not a normal bowel movement. He denies any recent medication changes and denies any use of narcotics at this  time. He only drinks alcohol 1-2 times a month and denies any recent alcohol use. No prior surgeries other than a hernia repair and he denies any testicular or urinary issues.  Concern for appendicitis versus constipation versus kidney stone. Low suspicion for cholecystitis, pancreatitis or gastritis.  Patient given IV fluids, pain and nausea control. CBC, CMP, urine pending.  9:12 AM UA wnl and CBC with mild leukocytosis.  CT abd/pelvis pending.  10:34 AM Pt with appendicitis.  General surgery consulted for admission and futher care.  Gwyneth Sprout, MD 05/26/14 1034  Gwyneth Sprout, MD 05/26/14 1037

## 2014-05-26 NOTE — H&P (Signed)
Patient seen and examined.  Plan laparoscopic, possible open appendectomy.  I have discussed the procedure and risks of appendectomy. The risks include but are not limited to bleeding, infection, wound problems, anesthesia, injury to intra-abdominal organs, possibility of postoperative ileus. He seems to understand and agrees with the plan.

## 2014-05-26 NOTE — Transfer of Care (Signed)
Immediate Anesthesia Transfer of Care Note  Patient: Travis Cervantes  Procedure(s) Performed: Procedure(s): APPENDECTOMY LAPAROSCOPIC (N/A)  Patient Location: PACU  Anesthesia Type:General  Level of Consciousness: awake, alert , oriented and patient cooperative  Airway & Oxygen Therapy: Patient Spontanous Breathing and Patient connected to face mask oxygen  Post-op Assessment: Report given to PACU RN, Post -op Vital signs reviewed and stable and Patient moving all extremities X 4  Post vital signs: Reviewed and stable  Complications: No apparent anesthesia complications

## 2014-05-26 NOTE — ED Notes (Signed)
Consent form not obtained, Pam at short stay will complete.

## 2014-05-26 NOTE — Discharge Instructions (Signed)
[facility] ______[facility] LAPAROSCOPIC SURGERY: POST OP INSTRUCTIONS Always review your discharge instruction sheet given to you by the facility where your surgery was performed. IF YOU HAVE DISABILITY OR FAMILY LEAVE FORMS, YOU MUST BRING THEM TO THE OFFICE FOR PROCESSING.   DO NOT GIVE THEM TO YOUR DOCTOR.  1. A prescription for pain medication may be given to you upon discharge.  Take your pain medication as prescribed, if needed.  If narcotic pain medicine is not needed, then you may take acetaminophen (Tylenol) or ibuprofen (Advil) as needed. 2. Take your usually prescribed medications unless otherwise directed. 3. If you need a refill on your pain medication, please contact your pharmacy.  They will contact our office to request authorization. Prescriptions will not be filled after 5pm or on week-ends. 4. You should follow a light diet the first few days after arrival home, such as soup and crackers, etc.  Be sure to include lots of fluids daily. 5. Most patients will experience some swelling and bruising in the area of the incisions.  Ice packs will help.  Swelling and bruising can take several days to resolve.  6. It is common to experience some constipation if taking pain medication after surgery.  Increasing fluid intake and taking a stool softener (such as Colace) will usually help or prevent this problem from occurring.  A mild laxative (Milk of Magnesia or Miralax) should be taken according to package instructions if there are no bowel movements after 48 hours. 7. Unless discharge instructions indicate otherwise, you may remove your bandages 72 hours after surgery; you may shower.  You may have steri-strips (small skin tapes) in place directly over the incision.  These strips should be left on the skin.  If your surgeon used skin glue on the incision, you may shower in 24 hours.  The glue will flake off over the next 2-3 weeks.  Any sutures or staples will be removed at the  office during your follow-up visit. 8. ACTIVITIES:  You may resume regular (light) daily activities beginning the next day--such as daily self-care, walking, climbing stairs--gradually increasing activities as tolerated.  You may have sexual intercourse when it is comfortable.  Refrain from any heavy lifting or straining-nothing over 10 pounds for 2 weeks.  a. You may drive when you are no longer taking prescription pain medication, you can comfortably wear a seatbelt, and you can safely maneuver your car and apply brakes. b. RETURN TO WORK:  _Light work in one week, full duty in 2 weeks._________________________________________________________ 9. You should see your doctor in the office for a follow-up appointment approximately 2-3 weeks after your surgery.  Make sure that you call for this appointment within a day or two after you arrive home to insure a convenient appointment time. 10. OTHER INSTRUCTIONS: __________________________________________________________________________________________________________________________ __________________________________________________________________________________________________________________________ WHEN TO CALL YOUR DOCTOR: 1. Fever over 101.0 2. Inability to urinate 3. Continued bleeding from incision. 4. Increased pain, redness, or drainage from the incision. 5. Increasing abdominal pain  The clinic staff is available to answer your questions during regular business hours.  Please dont hesitate to call and ask to speak to one of the nurses for clinical concerns.  If you have a medical emergency, go to the nearest emergency room or call 911.  A surgeon from [facility] is always on call at the hospital. [address], [address], [place], [place]  [address] ? [address] [address], [place], [place]   [address] [phone] ? [phone] ? FAX (  [phone] Web site: [email or web address]

## 2014-05-27 ENCOUNTER — Encounter (HOSPITAL_COMMUNITY): Payer: Self-pay | Admitting: General Surgery

## 2014-05-27 MED ORDER — OXYCODONE-ACETAMINOPHEN 5-325 MG PO TABS: 1.0000 | ORAL_TABLET | ORAL | Status: DC | PRN

## 2014-05-27 NOTE — Progress Notes (Signed)
Patient was ready for d/c to home. Ambulates frequently independently. C/o gassy pain to rt shoulder. Percocet tab accepted for c/o pain. Drgs intact to abd. Patient relayed PA told him he can shower and remove drsgs in 72 hour time frame. Discussed discharged orders with him. Mother arrive to transport home.

## 2014-05-27 NOTE — Discharge Summary (Signed)
Having some shoulder discomfort but no abdominal pain.  Ready for discharge.

## 2014-05-27 NOTE — Discharge Summary (Signed)
Patient ID: AAREON DRASS MRN: [id number] DOB/AGE: 08/24/1991 22 y.o.  Admit date: 05/26/2014 Discharge date: 05/27/2014  Procedures: laparoscopic appendectomy  Consults: None  Reason for Admission: This is a 23 yo white male who developed constipation on Friday. This is unusual for him. On Monday he developed LLQ abdominal pain. No nausea or vomiting. No fevers. Pain persisted yesterday and he took a laxative and suppository. He still didn't have a BM. His pain continued into this morning. He went to [facility] where he had a CT scan that revealed acute appendicitis. He was transferred to [facility] for surgical intervention.  Admission Diagnoses:  1. Acute appendicitis  Hospital Course: The patient was admitted and taken to the OR for a lap appy. He tolerated this procedure well.  On POD 1, he was tolerating solid food and he had no pain.  He was stable for dc home.  PE: Abd: soft, appropriately tender, +BS, ND, incisions c/d/i  Discharge Diagnoses:  Principal Problem:   Acute appendicitis s/p lap appy  Discharge Medications:   Medication List         bisacodyl 10 MG suppository  Commonly known as:  DULCOLAX  Place 10 mg rectally as needed for moderate constipation.     EX-LAX 15 MG Chew  Generic drug:  Sennosides  Chew 1 each by mouth daily as needed. For constipation     oxyCODONE-acetaminophen 5-325 MG per tablet  Commonly known as:  PERCOCET/ROXICET  Take 1-2 tablets by mouth every 4 (four) hours as needed for moderate pain.     testosterone 50 MG/5GM (1%) Gel  Commonly known as:  ANDROGEL  Place 5 g onto the skin daily.        Discharge Instructions:     Follow-up Information   Follow up with [facility] On 06/15/2014. (3:15pm, arrive by 2:45pm for paperwork)    Contact information:   [address]   [place] [address] [phone]       Signed: Letha Cape 05/27/2014, 8:20 AM

## 2014-06-15 ENCOUNTER — Encounter (INDEPENDENT_AMBULATORY_CARE_PROVIDER_SITE_OTHER): Payer: BC Managed Care – PPO | Admitting: General Surgery

## 2014-06-16 ENCOUNTER — Encounter (INDEPENDENT_AMBULATORY_CARE_PROVIDER_SITE_OTHER): Payer: Self-pay | Admitting: General Surgery

## 2014-07-06 ENCOUNTER — Encounter (INDEPENDENT_AMBULATORY_CARE_PROVIDER_SITE_OTHER): Payer: BC Managed Care – PPO

## 2014-07-21 ENCOUNTER — Encounter (INDEPENDENT_AMBULATORY_CARE_PROVIDER_SITE_OTHER): Payer: Self-pay | Admitting: General Surgery

## 2015-03-19 ENCOUNTER — Ambulatory Visit: Payer: Self-pay

## 2015-04-24 ENCOUNTER — Emergency Department (HOSPITAL_COMMUNITY): Payer: BLUE CROSS/BLUE SHIELD

## 2015-04-24 ENCOUNTER — Emergency Department (HOSPITAL_COMMUNITY)
Admission: EM | Admit: 2015-04-24 | Discharge: 2015-04-24 | Disposition: A | Discharge status: Home / Self Care | Payer: BLUE CROSS/BLUE SHIELD | Attending: Emergency Medicine | Admitting: Emergency Medicine

## 2015-04-24 ENCOUNTER — Other Ambulatory Visit: Payer: Self-pay

## 2015-04-24 ENCOUNTER — Encounter (HOSPITAL_COMMUNITY): Payer: Self-pay | Admitting: *Deleted

## 2015-04-24 DIAGNOSIS — E291 Testicular hypofunction: Secondary | ICD-10-CM | POA: Insufficient documentation

## 2015-04-24 DIAGNOSIS — R079 Chest pain, unspecified: Secondary | ICD-10-CM | POA: Insufficient documentation

## 2015-04-24 DIAGNOSIS — R0602 Shortness of breath: Secondary | ICD-10-CM | POA: Diagnosis not present

## 2015-04-24 DIAGNOSIS — Z79899 Other long term (current) drug therapy: Secondary | ICD-10-CM | POA: Diagnosis not present

## 2015-04-24 DIAGNOSIS — R05 Cough: Secondary | ICD-10-CM | POA: Insufficient documentation

## 2015-04-24 DIAGNOSIS — Z720 Tobacco use: Secondary | ICD-10-CM | POA: Diagnosis not present

## 2015-04-24 NOTE — Discharge Instructions (Signed)
Ibuprofen 600 mg 3 times daily for the next 5 days.  Follow-up with your primary Dr. if not improving in the next week, and return to the ER if your symptoms significantly worsen or change.   Chest Wall Pain Chest wall pain is pain in or around the bones and muscles of your chest. It may take up to 6 weeks to get better. It may take longer if you must stay physically active in your work and activities.  CAUSES  Chest wall pain may happen on its own. However, it may be caused by:  A viral illness like the flu.  Injury.  Coughing.  Exercise.  Arthritis.  Fibromyalgia.  Shingles. HOME CARE INSTRUCTIONS   Avoid overtiring physical activity. Try not to strain or perform activities that cause pain. This includes any activities using your chest or your abdominal and side muscles, especially if heavy weights are used.  Put ice on the sore area.  Put ice in a plastic bag.  Place a towel between your skin and the bag.  Leave the ice on for 15-20 minutes per hour while awake for the first 2 days.  Only take over-the-counter or prescription medicines for pain, discomfort, or fever as directed by your caregiver. SEEK IMMEDIATE MEDICAL CARE IF:   Your pain increases, or you are very uncomfortable.  You have a fever.  Your chest pain becomes worse.  You have new, unexplained symptoms.  You have nausea or vomiting.  You feel sweaty or lightheaded.  You have a cough with phlegm (sputum), or you cough up blood. MAKE SURE YOU:   Understand these instructions.  Will watch your condition.  Will get help right away if you are not doing well or get worse. Document Released: 12/03/2005 Document Revised: 02/25/2012 Document Reviewed: 07/30/2011 [facility] Patient Information 2015 [employer], [place]. This information is not intended to replace advice given to you by your health care provider. Make sure you discuss any questions you have with your health care provider.

## 2015-04-24 NOTE — ED Notes (Signed)
Patient presents stating that he has been having chest pain for about 1 month off and on.

## 2015-04-24 NOTE — ED Provider Notes (Signed)
CSN: [id number]     Arrival date & time 04/24/15  0248 History   This chart was scribed for Geoffery Lyonsouglas Kareem Cathey, MD by Evon Slackerrance Branch, ED Scribe. This patient was seen in room A11C/A11C and the patient's care was started at 3:53 AM.      Chief Complaint  Patient presents with  . Chest Pain    Patient is a 24 y.o. male presenting with chest pain. The history is provided by the patient. No language interpreter was used.  Chest Pain Pain location:  L chest and R chest Pain radiates to the back: no   Pain severity:  Mild Onset quality:  Gradual Duration:  1 month Timing:  Intermittent Relieved by:  None tried Worsened by:  Nothing tried Ineffective treatments:  None tried Associated symptoms: cough and shortness of breath   Associated symptoms: no fever    HPI Comments: Alycia Patteneter D Guthridge is a 24 y.o. male who presents to the Emergency Department complaining of intermittent upper left CP for 1 month. Pt reports the pain intermittently radiates to the right side of his chest. Pt reports cough but states that he is smoker and relates this to his persistent cough. Pt also intermittent Sob over the past month. Pt states that the pain is intermittently worse with deep breathing. 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 Pt doesn't report any other symptoms.   Past Medical History  Diagnosis Date  . Low serum testosterone level    Past Surgical History  Procedure Laterality Date  . Hernia repair Right inguinal    distant  . Laparoscopic appendectomy N/A 05/26/2014    Procedure: APPENDECTOMY LAPAROSCOPIC;  Surgeon: Adolph Pollackodd J Rosenbower, MD;  Location: [facility]S;  Service: General;  Laterality: N/A;   No family history on file. History  Substance Use Topics  . Smoking status: Current Every Day Smoker -- 1.00 packs/day    Types: Cigarettes  . Smokeless tobacco: Current User  . Alcohol Use: Yes     Comment: occ    Review  of Systems  Constitutional: Negative for fever.  Respiratory: Positive for cough and shortness of breath.   Cardiovascular: Positive for chest pain.  All other systems reviewed and are negative.     Allergies  Review of patient's allergies indicates no known allergies.  Home Medications   Prior to Admission medications   Medication Sig Start Date End Date Taking? Authorizing Provider  bisacodyl (DULCOLAX) 10 MG suppository Place 10 mg rectally as needed for moderate constipation.    Historical Provider, MD  oxyCODONE-acetaminophen (PERCOCET/ROXICET) 5-325 MG per tablet Take 1-2 tablets by mouth every 4 (four) hours as needed for moderate pain. 05/27/14   Barnetta ChapelKelly Osborne, PA-C  Sennosides (EX-LAX) 15 MG CHEW Chew 1 each by mouth daily as needed. For constipation    Historical Provider, MD  testosterone (ANDROGEL) 50 MG/5GM (1%) GEL Place 5 g onto the skin daily.    Historical Provider, MD   BP 116/64 mmHg  Pulse 75  Temp(Src) 97.8 F (36.6 C) (Oral)  Resp 16  Ht 6\' 1"  (1.854 m)  Wt 226 lb (102.513 kg)  BMI 29.82 kg/m2  SpO2 97%   Physical Exam  Constitutional: He is oriented to person, place, and time. He appears well-developed and well-nourished. No distress.  HENT:  Head: Normocephalic and atraumatic.  Eyes: Conjunctivae and EOM are normal.  Neck: Neck supple.  No tracheal deviation present.  Cardiovascular: Normal rate, regular rhythm and normal heart sounds.   No murmur heard. Pulmonary/Chest: Effort normal and breath sounds normal. No respiratory distress. He has no wheezes. He has no rales.  Abdominal: Soft. There is no tenderness.  Musculoskeletal: Normal range of motion.  Neurological: He is alert and oriented to person, place, and time.  Skin: Skin is warm and dry.  Psychiatric: He has a normal mood and affect. His behavior is normal.  Nursing note and vitals reviewed.   ED Course  Procedures (including critical care time) DIAGNOSTIC STUDIES: Oxygen Saturation  is 97% on RA, normal by my interpretation.    COORDINATION OF CARE: 3:59 AM-Discussed treatment plan with pt at bedside and pt agreed to plan.     Labs Review Labs Reviewed - No data to display  Imaging Review No results found.  ED ECG REPORT   Date: 04/24/2015  Rate: 80  Rhythm: normal sinus rhythm  QRS Axis: normal  Intervals: normal  ST/T Wave abnormalities: normal  Conduction Disutrbances:none  Narrative Interpretation:   Old EKG Reviewed: none available  I have personally reviewed the EKG tracing and agree with the computerized printout as noted.   MDM   Final diagnoses:  None      Patient is a [age] year old male who presents with a one-month history of intermittent chest discomfort. He denies any injury or trauma. He denies any difficulty breathing. There is no productive cough. His EKG is normal and chest x-ray is unremarkable as well. I highly suspect a musculoskeletal etiology and will discharge with ibuprofen and when necessary return.   I personally performed the services described in this documentation, which was scribed in my presence. The recorded information has been reviewed and is accurate.      Geoffery Lyonsouglas Sunny Gains, MD 04/24/15 [phone]

## 2015-04-24 NOTE — ED Notes (Signed)
Patient transported to X-ray 

## 2018-01-18 ENCOUNTER — Encounter: Payer: Self-pay | Admitting: Emergency Medicine

## 2018-01-18 ENCOUNTER — Other Ambulatory Visit: Payer: Self-pay

## 2018-01-18 ENCOUNTER — Emergency Department
Admission: EM | Admit: 2018-01-18 | Discharge: 2018-01-18 | Disposition: A | Discharge status: Home / Self Care | Payer: Worker's Compensation | Attending: Emergency Medicine | Admitting: Emergency Medicine

## 2018-01-18 DIAGNOSIS — Z7721 Contact with and (suspected) exposure to potentially hazardous body fluids: Secondary | ICD-10-CM | POA: Insufficient documentation

## 2018-01-18 DIAGNOSIS — Z87891 Personal history of nicotine dependence: Secondary | ICD-10-CM | POA: Diagnosis not present

## 2018-01-18 LAB — RAPID HIV SCREEN (HIV 1/2 AB+AG)
HIV 1/2 ANTIBODIES: NONREACTIVE
HIV-1 P24 Antigen - HIV24: NONREACTIVE
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
## 2018-01-18 NOTE — ED Notes (Signed)
Pt discharged to home.  Discharge instructions reviewed.  Verbalized understanding.  No questions or concerns at this time.  Teach back verified.  Pt in NAD.  No items left in ED.   
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
## 2018-01-18 NOTE — ED Notes (Signed)
Spoke with pt's supervisor, Mining engineerofficer Anemaet, states he does not need UDS or breath analysis for WC

## 2018-01-21 LAB — HEPATITIS A ANTIBODY, IGM: HEP A IGM: NEGATIVE

## 2018-01-21 LAB — HEPATITIS C ANTIBODY: HCV Ab: <0.1 s/co ratio (ref 0.0–0.9)

## 2018-01-21 LAB — HEPATITIS B CORE ANTIBODY, IGM: HEP B C IGM: NEGATIVE

## 2018-09-19 ENCOUNTER — Encounter: Payer: Self-pay | Admitting: Emergency Medicine

## 2018-09-19 ENCOUNTER — Emergency Department: Payer: No Typology Code available for payment source

## 2018-09-19 ENCOUNTER — Emergency Department
Admission: EM | Admit: 2018-09-19 | Discharge: 2018-09-20 | Disposition: A | Discharge status: Home / Self Care | Payer: No Typology Code available for payment source | Attending: Emergency Medicine | Admitting: Emergency Medicine

## 2018-09-19 ENCOUNTER — Other Ambulatory Visit: Payer: Self-pay

## 2018-09-19 DIAGNOSIS — Z87891 Personal history of nicotine dependence: Secondary | ICD-10-CM | POA: Diagnosis not present

## 2018-09-19 DIAGNOSIS — S0181XA Laceration without foreign body of other part of head, initial encounter: Secondary | ICD-10-CM | POA: Diagnosis present

## 2018-09-19 DIAGNOSIS — S0990XA Unspecified injury of head, initial encounter: Secondary | ICD-10-CM | POA: Diagnosis not present

## 2018-09-19 DIAGNOSIS — Y990 Civilian activity done for income or pay: Secondary | ICD-10-CM | POA: Insufficient documentation

## 2018-09-19 DIAGNOSIS — W500XXA Accidental hit or strike by another person, initial encounter: Secondary | ICD-10-CM | POA: Insufficient documentation

## 2018-09-19 DIAGNOSIS — Y929 Unspecified place or not applicable: Secondary | ICD-10-CM | POA: Diagnosis not present

## 2018-09-19 DIAGNOSIS — Y9389 Activity, other specified: Secondary | ICD-10-CM | POA: Diagnosis not present

## 2018-09-19 NOTE — ED Notes (Signed)
ED Provider at bedside. 
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
## 2018-09-20 LAB — RAPID HIV SCREEN (HIV 1/2 AB+AG)
HIV 1/2 Antibodies: NONREACTIVE
HIV-1 P24 Antigen - HIV24: NONREACTIVE

## 2018-09-20 MED ORDER — LIDOCAINE HCL (PF) 1 % IJ SOLN
INTRAMUSCULAR | Status: AC
  Filled 2018-09-20: qty 10

## 2018-09-20 MED ORDER — TRAMADOL HCL 50 MG PO TABS: 50.0000 mg | ORAL_TABLET | Freq: Four times a day (QID) | ORAL | 0 refills | Status: DC | PRN

## 2018-09-20 NOTE — ED Notes (Signed)
MD at bedside for laceration repair.

## 2018-09-20 NOTE — ED Notes (Addendum)
This RN cleaned patient's laceration per MD request

## 2018-09-20 NOTE — Discharge Instructions (Addendum)
Please keep the wound dry for the next 48 hours after which she may wash but do not rub dry pat dry or allowed to air dry.  Glue will wear off over the next 3 to 5 days sutures will fall out within the next 7 days.  Return to the emergency department for any signs of infection such as pus increased redness/pain or fever.  As we discussed your I likely will show bruising over the next several days consistent with a black eye.  Please follow-up with employee health for further blood work at 6 weeks, 3 months and 6 months post exposure.

## 2018-10-08 DIAGNOSIS — F0781 Postconcussional syndrome: Secondary | ICD-10-CM | POA: Insufficient documentation

## 2018-12-08 ENCOUNTER — Ambulatory Visit: Payer: Self-pay | Admitting: Family Medicine

## 2019-06-30 DIAGNOSIS — F5105 Insomnia due to other mental disorder: Secondary | ICD-10-CM | POA: Diagnosis not present

## 2019-06-30 DIAGNOSIS — R69 Illness, unspecified: Secondary | ICD-10-CM | POA: Diagnosis not present

## 2019-06-30 DIAGNOSIS — F411 Generalized anxiety disorder: Secondary | ICD-10-CM | POA: Diagnosis not present

## 2019-06-30 DIAGNOSIS — R41840 Attention and concentration deficit: Secondary | ICD-10-CM | POA: Diagnosis not present

## 2019-09-11 ENCOUNTER — Other Ambulatory Visit: Payer: Self-pay

## 2019-09-16 DIAGNOSIS — R69 Illness, unspecified: Secondary | ICD-10-CM | POA: Diagnosis not present

## 2019-09-16 DIAGNOSIS — R5383 Other fatigue: Secondary | ICD-10-CM | POA: Diagnosis not present

## 2019-09-16 DIAGNOSIS — F411 Generalized anxiety disorder: Secondary | ICD-10-CM | POA: Diagnosis not present

## 2019-09-16 DIAGNOSIS — F5105 Insomnia due to other mental disorder: Secondary | ICD-10-CM | POA: Diagnosis not present

## 2019-09-16 DIAGNOSIS — R41840 Attention and concentration deficit: Secondary | ICD-10-CM | POA: Diagnosis not present

## 2019-12-15 DIAGNOSIS — R5383 Other fatigue: Secondary | ICD-10-CM | POA: Diagnosis not present

## 2019-12-15 DIAGNOSIS — F5105 Insomnia due to other mental disorder: Secondary | ICD-10-CM | POA: Diagnosis not present

## 2019-12-15 DIAGNOSIS — F411 Generalized anxiety disorder: Secondary | ICD-10-CM | POA: Diagnosis not present

## 2019-12-15 DIAGNOSIS — R69 Illness, unspecified: Secondary | ICD-10-CM | POA: Diagnosis not present

## 2019-12-15 DIAGNOSIS — R41840 Attention and concentration deficit: Secondary | ICD-10-CM | POA: Diagnosis not present

## 2020-01-07 DIAGNOSIS — F5105 Insomnia due to other mental disorder: Secondary | ICD-10-CM | POA: Diagnosis not present

## 2020-01-07 DIAGNOSIS — R69 Illness, unspecified: Secondary | ICD-10-CM | POA: Diagnosis not present

## 2020-01-07 DIAGNOSIS — F411 Generalized anxiety disorder: Secondary | ICD-10-CM | POA: Diagnosis not present

## 2020-01-07 DIAGNOSIS — R5383 Other fatigue: Secondary | ICD-10-CM | POA: Diagnosis not present

## 2020-01-07 DIAGNOSIS — R41840 Attention and concentration deficit: Secondary | ICD-10-CM | POA: Diagnosis not present

## 2020-03-10 DIAGNOSIS — R41840 Attention and concentration deficit: Secondary | ICD-10-CM | POA: Diagnosis not present

## 2020-03-10 DIAGNOSIS — R69 Illness, unspecified: Secondary | ICD-10-CM | POA: Diagnosis not present

## 2020-03-10 DIAGNOSIS — F5105 Insomnia due to other mental disorder: Secondary | ICD-10-CM | POA: Diagnosis not present

## 2020-03-10 DIAGNOSIS — F411 Generalized anxiety disorder: Secondary | ICD-10-CM | POA: Diagnosis not present

## 2020-03-18 IMAGING — CT CT HEAD W/O CM
3 series · 15 of 47 positions shown, 18 images · non-contrast
Comparison: None.

CLINICAL DATA: Head trauma, minor, GCS>=13, low clinical risk,
initial exam. Headache.

EXAM:
CT HEAD WITHOUT CONTRAST
TECHNIQUE: Contiguous axial images were obtained from the base of the skull
through the vertex without intravenous contrast.

[Series 3: head wo · axial · 0.45mm/px · z∈[+316,+446]mm · 9 of 32 slices shown, 12 images]
[im 3/32  brain]
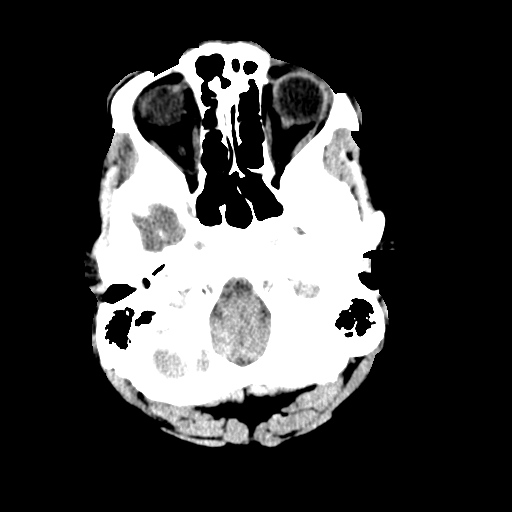
[im 3/32  bone]
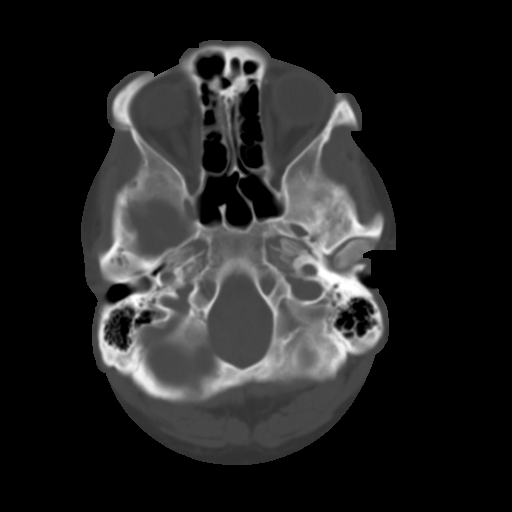
[im 6/32  brain]
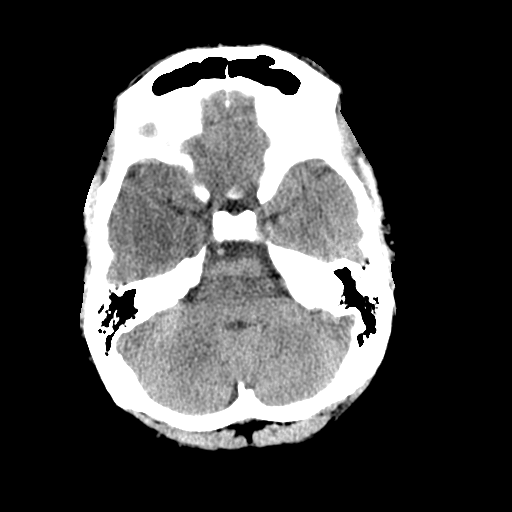
[im 9/32  brain]
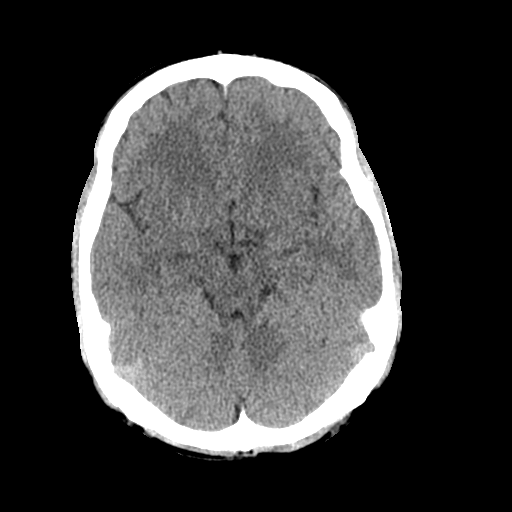
[im 12/32  brain]
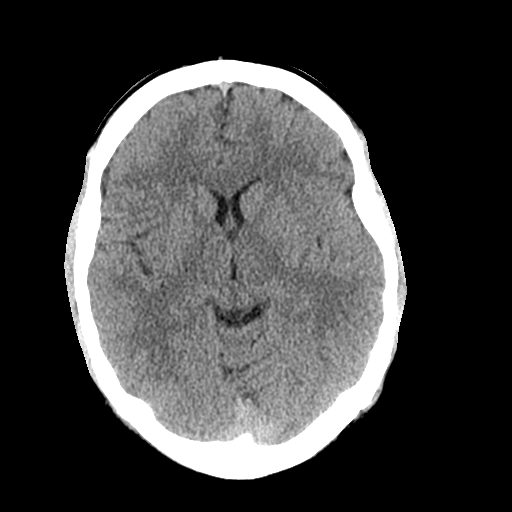
[im 17/32  brain]
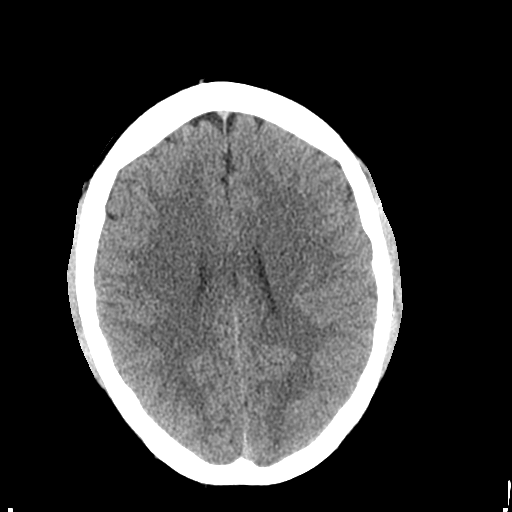
[im 17/32  bone]
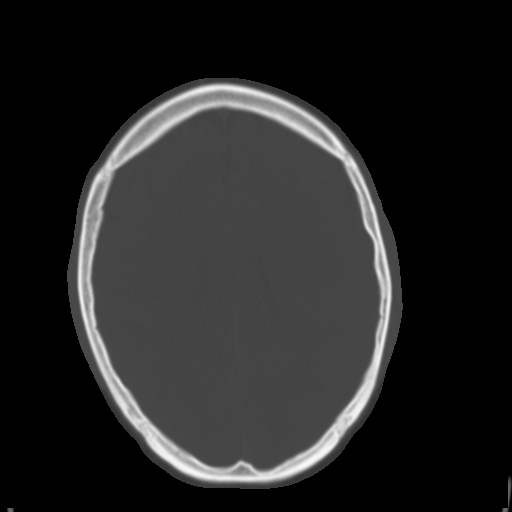
[im 20/32  brain]
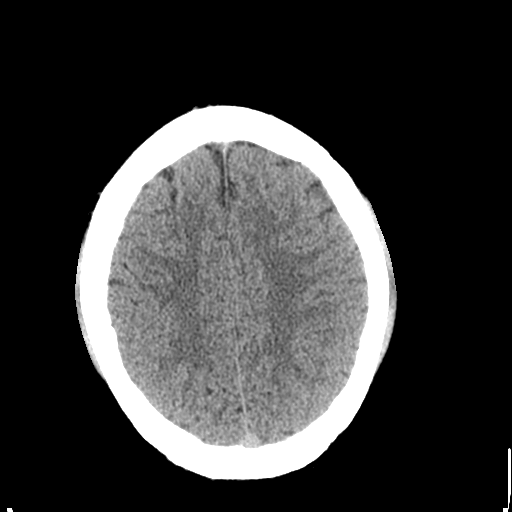
[im 23/32  brain]
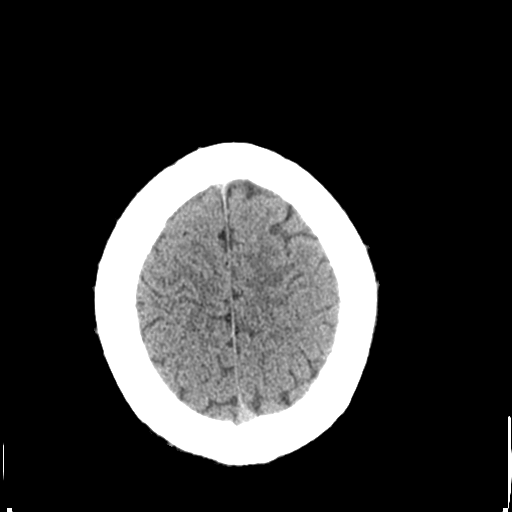
[im 26/32  brain]
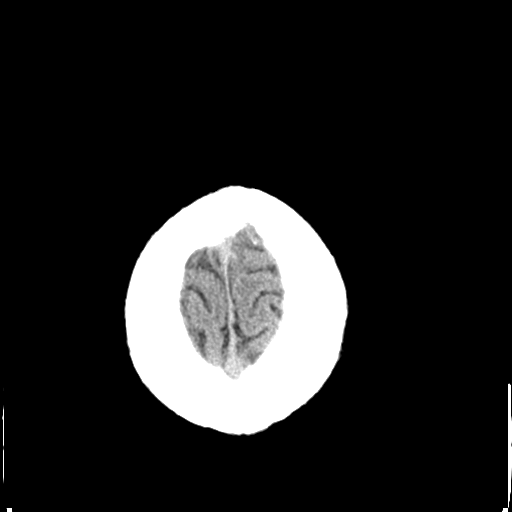
[im 29/32  brain]
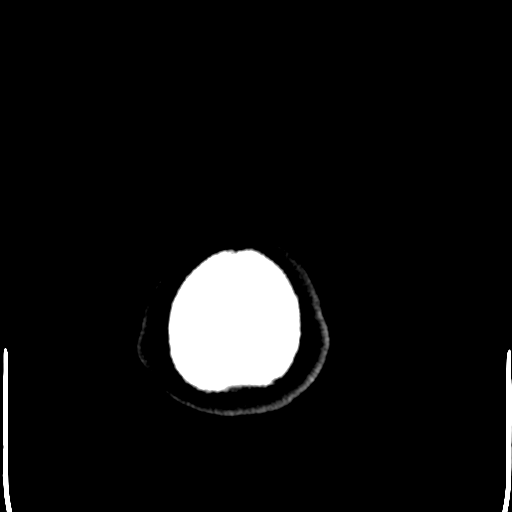
[im 29/32  bone]
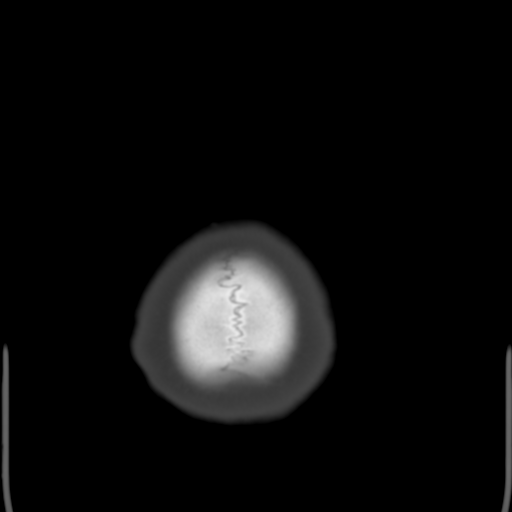

[Series 4: coronal soft tissue · coronal · 0.31mm/px · 3 of 64 slices shown]
[im 22/64  brain]
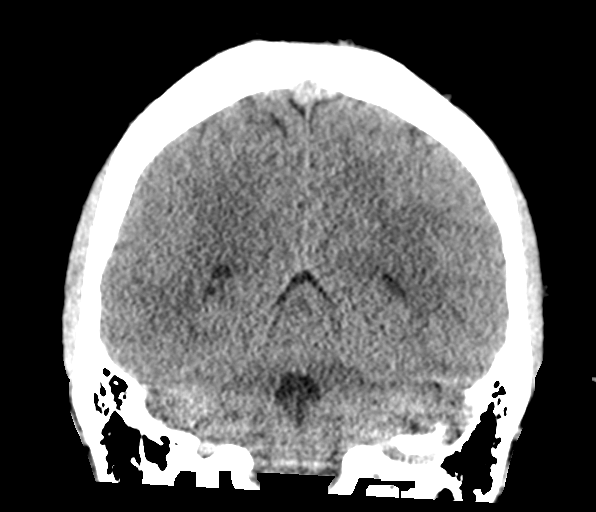
[im 29/64  brain]
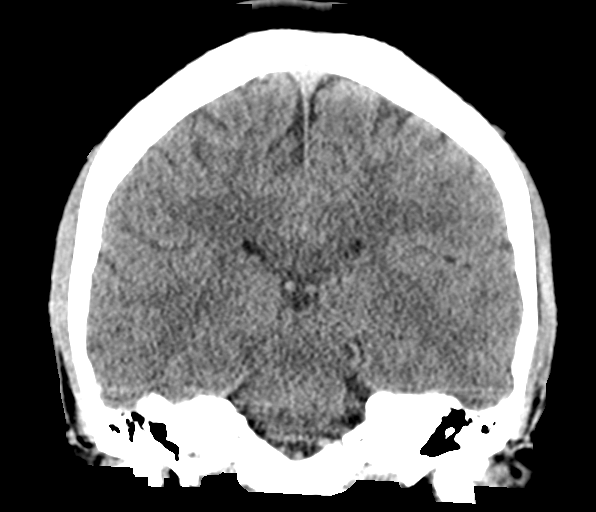
[im 36/64  brain]
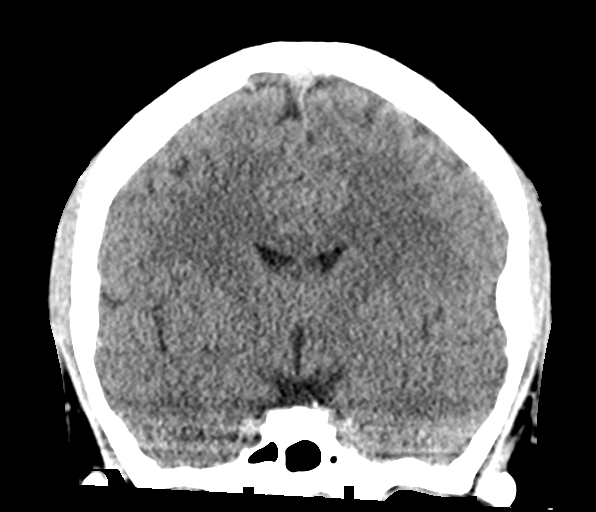

[Series 5: sagittal soft tissue · sagittal · 0.30mm/px · 3 of 55 slices shown]
[im 19/55  brain]
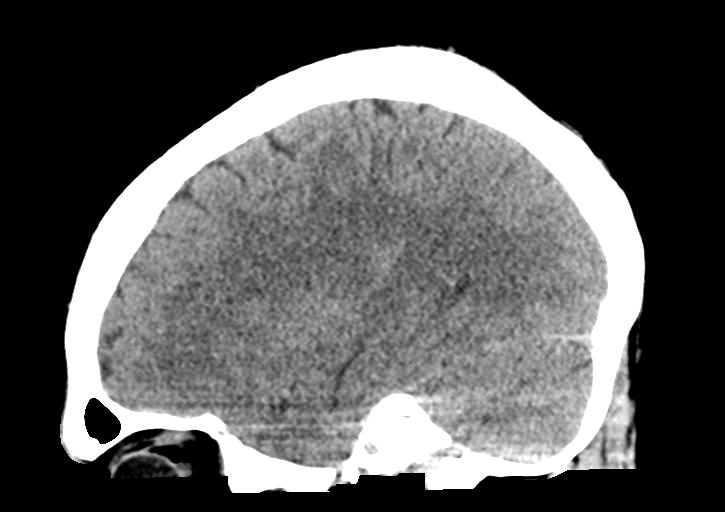
[im 28/55  brain]
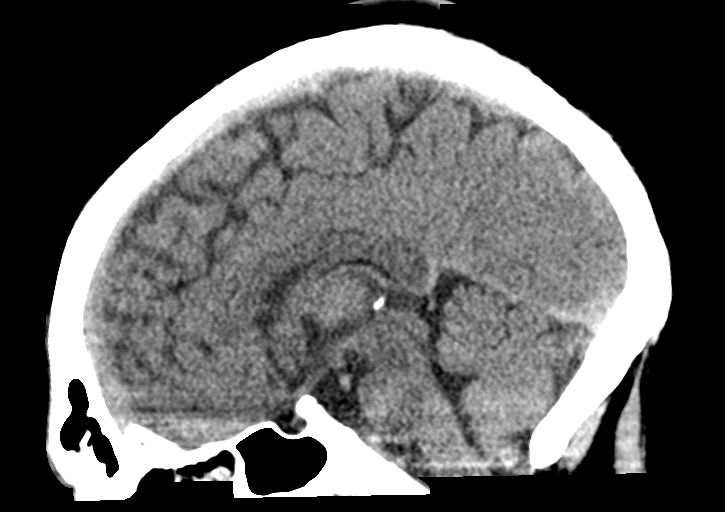
[im 37/55  brain]
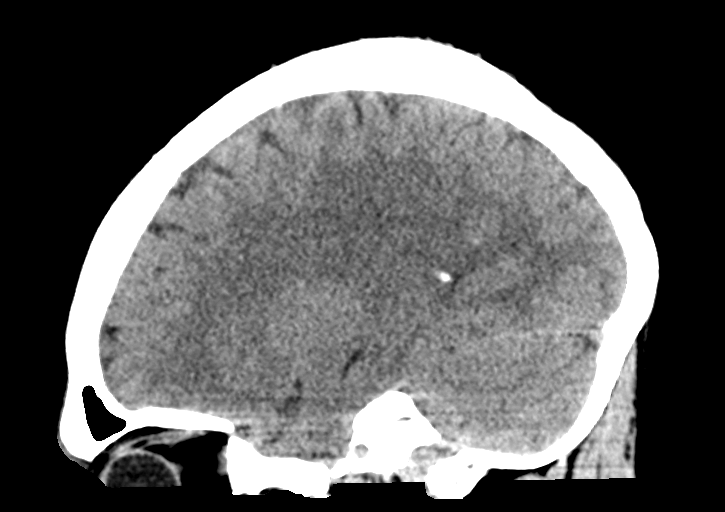

[15 of 47 positions shown; findings below may reference images not displayed]

FINDINGS: Brain: No intracranial hemorrhage, mass effect, or midline shift. No
hydrocephalus. The basilar cisterns are patent. No evidence of
territorial infarct or acute ischemia. No extra-axial or
intracranial fluid collection.

Vascular: No hyperdense vessel or unexpected calcification.

Skull: No fracture or focal lesion.

Sinuses/Orbits: Minimal mucosal thickening of ethmoid air cells. No
sinus fluid levels. Mastoid air cells are clear. Included orbits are
unremarkable.

Other: None.
IMPRESSION: Many mucosal thickening of the ethmoid air cells. Otherwise negative
head CT.

## 2020-04-05 ENCOUNTER — Ambulatory Visit: Payer: 59 | Admitting: Emergency Medicine

## 2020-04-05 ENCOUNTER — Encounter: Payer: Self-pay | Admitting: Emergency Medicine

## 2020-04-05 ENCOUNTER — Other Ambulatory Visit: Payer: Self-pay

## 2020-04-05 VITALS — BP 130/78 | HR 76 | Temp 98.4°F | Resp 12 | Ht 72.0 in | Wt 235.0 lb

## 2020-04-05 DIAGNOSIS — F419 Anxiety disorder, unspecified: Secondary | ICD-10-CM | POA: Insufficient documentation

## 2020-04-05 DIAGNOSIS — R109 Unspecified abdominal pain: Secondary | ICD-10-CM | POA: Diagnosis not present

## 2020-04-05 DIAGNOSIS — R112 Nausea with vomiting, unspecified: Secondary | ICD-10-CM | POA: Diagnosis not present

## 2020-04-05 DIAGNOSIS — E785 Hyperlipidemia, unspecified: Secondary | ICD-10-CM | POA: Insufficient documentation

## 2020-04-05 DIAGNOSIS — A084 Viral intestinal infection, unspecified: Secondary | ICD-10-CM

## 2020-04-05 DIAGNOSIS — Z03818 Encounter for observation for suspected exposure to other biological agents ruled out: Secondary | ICD-10-CM | POA: Diagnosis not present

## 2020-04-05 DIAGNOSIS — R1084 Generalized abdominal pain: Secondary | ICD-10-CM | POA: Diagnosis not present

## 2020-04-05 DIAGNOSIS — K219 Gastro-esophageal reflux disease without esophagitis: Secondary | ICD-10-CM | POA: Insufficient documentation

## 2020-04-05 DIAGNOSIS — R509 Fever, unspecified: Secondary | ICD-10-CM | POA: Diagnosis not present

## 2020-04-05 DIAGNOSIS — R197 Diarrhea, unspecified: Secondary | ICD-10-CM | POA: Diagnosis not present

## 2020-04-05 MED ORDER — ONDANSETRON 4 MG PO TBDP: ORAL_TABLET | ORAL | 0 refills | Status: DC

## 2020-04-05 NOTE — Progress Notes (Signed)
S/Sx started on Sunday afternoon - Saturday had a dizzy spell (was just sitting on the couch). Fever started Monday morning - 1st time he checked & it was 102. Unable to keep food & fluids down - Nausea & vomiting after every time of ingesting foods or fluids.   Headache & bodyaches - felt sore all over. Called [facility] yesterday morning - take tylenol & ibuprofen.  Was going to prescribe something for nauea but declined it. Last took 600mg  (3x 200mg ) ibuprofen at bedtime. Diarrhea - watery & yellow - Had this AM when woke up. Both Sunday & Monday woke up sweating. Computer line up this morning - hasn't been around anyone in the [employer] yet.  Completed Covid vaccines - February - Pfizer.  AMD

## 2020-04-05 NOTE — Progress Notes (Signed)
S:  Began with sx on Sunday of feeling bad and dizzy.  Saturday began with fever, nausea and vomiting.  Later he started with diarrhea approx. 4-5 times per day.  Still not able to eat since Sunday.  Monday am had temp of 102.  Headache and generalized body aches. Took IBU 600 mg last pm.  Has still not eating anything today and has continued nausea.  Patient had a video doctor visit yesterday and was offered something for nausea but patient declined.  Today he is feeling worse and is supposed to work.  He states no one in his family have similar symptoms.  He completed his Covid vaccine in February and is unaware of any exposure.  O: Lungs are clear bilaterally, heart regular rate and rhythm without murmur.  Abdomen soft, nontender.  Bowel sounds are decreased x4 quadrants.  A: Viral gastroenteritis versus Covid 19  P: Zofran 4 mg ODT was sent to his pharmacy.  He is to take 1 or 2 every 8 hours as needed for nausea.  He is to start slowly with ice chips, ginger ale and slowly advance to rehydrate.  Patient also is going to [facility] to have a Covid test done.  He was given a note to remain out of work until Friday.  Pending his Covid test this may be longer or 24 hours after the last time he has had a fever over 100.

## 2020-04-27 DIAGNOSIS — M9906 Segmental and somatic dysfunction of lower extremity: Secondary | ICD-10-CM | POA: Diagnosis not present

## 2020-04-27 DIAGNOSIS — M542 Cervicalgia: Secondary | ICD-10-CM | POA: Diagnosis not present

## 2020-04-27 DIAGNOSIS — M9903 Segmental and somatic dysfunction of lumbar region: Secondary | ICD-10-CM | POA: Diagnosis not present

## 2020-04-27 DIAGNOSIS — M546 Pain in thoracic spine: Secondary | ICD-10-CM | POA: Diagnosis not present

## 2020-04-27 DIAGNOSIS — M9902 Segmental and somatic dysfunction of thoracic region: Secondary | ICD-10-CM | POA: Diagnosis not present

## 2020-04-27 DIAGNOSIS — M545 Low back pain: Secondary | ICD-10-CM | POA: Diagnosis not present

## 2020-04-27 DIAGNOSIS — M9901 Segmental and somatic dysfunction of cervical region: Secondary | ICD-10-CM | POA: Diagnosis not present

## 2020-04-27 DIAGNOSIS — M25551 Pain in right hip: Secondary | ICD-10-CM | POA: Diagnosis not present

## 2020-04-29 DIAGNOSIS — M546 Pain in thoracic spine: Secondary | ICD-10-CM | POA: Diagnosis not present

## 2020-04-29 DIAGNOSIS — M9903 Segmental and somatic dysfunction of lumbar region: Secondary | ICD-10-CM | POA: Diagnosis not present

## 2020-04-29 DIAGNOSIS — M9906 Segmental and somatic dysfunction of lower extremity: Secondary | ICD-10-CM | POA: Diagnosis not present

## 2020-04-29 DIAGNOSIS — M542 Cervicalgia: Secondary | ICD-10-CM | POA: Diagnosis not present

## 2020-04-29 DIAGNOSIS — M9901 Segmental and somatic dysfunction of cervical region: Secondary | ICD-10-CM | POA: Diagnosis not present

## 2020-04-29 DIAGNOSIS — M9902 Segmental and somatic dysfunction of thoracic region: Secondary | ICD-10-CM | POA: Diagnosis not present

## 2020-04-29 DIAGNOSIS — M25551 Pain in right hip: Secondary | ICD-10-CM | POA: Diagnosis not present

## 2020-04-29 DIAGNOSIS — M545 Low back pain: Secondary | ICD-10-CM | POA: Diagnosis not present

## 2020-05-30 NOTE — Progress Notes (Signed)
Scheduled to complete physical 06/07/20 - No provider. Needs to reschedule provider visit.  Scheduled to complete physical 06/10/20 with Durward Parcel, PA-C.  AMD

## 2020-05-31 ENCOUNTER — Ambulatory Visit: Payer: Self-pay

## 2020-05-31 ENCOUNTER — Other Ambulatory Visit: Payer: Self-pay

## 2020-05-31 DIAGNOSIS — Z0000 Encounter for general adult medical examination without abnormal findings: Secondary | ICD-10-CM

## 2020-06-01 LAB — CMP12+LP+TP+TSH+6AC+CBC/D/PLT
ALT: 23 IU/L (ref 0–44)
AST: 24 IU/L (ref 0–40)
Albumin/Globulin Ratio: 2 (ref 1.2–2.2)
Albumin: 4.8 g/dL (ref 4.1–5.2)
Alkaline Phosphatase: 69 IU/L (ref 48–121)
BUN/Creatinine Ratio: 20 (ref 9–20)
BUN: 18 mg/dL (ref 6–20)
Basophils Absolute: 0 10*3/uL (ref 0.0–0.2)
Basos: 1 %
Bilirubin Total: 0.3 mg/dL (ref 0.0–1.2)
Calcium: 9.7 mg/dL (ref 8.7–10.2)
Chloride: 105 mmol/L (ref 96–106)
Chol/HDL Ratio: 5.9 ratio — ABNORMAL HIGH (ref 0.0–5.0)
Cholesterol, Total: 242 mg/dL — ABNORMAL HIGH (ref 100–199)
Creatinine, Ser: 0.92 mg/dL (ref 0.76–1.27)
EOS (ABSOLUTE): 0.2 10*3/uL (ref 0.0–0.4)
Eos: 4 %
Estimated CHD Risk: 1.2 times avg. — ABNORMAL HIGH (ref 0.0–1.0)
Free Thyroxine Index: 1.5 (ref 1.2–4.9)
GFR calc Af Amer: 132 mL/min/{1.73_m2} (ref >59)
GFR calc non Af Amer: 114 mL/min/{1.73_m2} (ref >59)
GGT: 20 IU/L (ref 0–65)
Globulin, Total: 2.4 g/dL (ref 1.5–4.5)
Glucose: 97 mg/dL (ref 65–99)
HDL: 41 mg/dL (ref >39)
Hematocrit: 42.1 % (ref 37.5–51.0)
Hemoglobin: 13.9 g/dL (ref 13.0–17.7)
Immature Grans (Abs): 0 10*3/uL (ref 0.0–0.1)
Immature Granulocytes: 0 %
Iron: 77 ug/dL (ref 38–169)
LDH: 163 IU/L (ref 121–224)
LDL Chol Calc (NIH): 175 mg/dL — ABNORMAL HIGH (ref 0–99)
Lymphocytes Absolute: 1.2 10*3/uL (ref 0.7–3.1)
Lymphs: 26 %
MCH: 28.7 pg (ref 26.6–33.0)
MCHC: 33 g/dL (ref 31.5–35.7)
MCV: 87 fL (ref 79–97)
Monocytes Absolute: 0.6 10*3/uL (ref 0.1–0.9)
Monocytes: 13 %
Neutrophils Absolute: 2.6 10*3/uL (ref 1.4–7.0)
Neutrophils: 56 %
Phosphorus: 3.5 mg/dL (ref 2.8–4.1)
Platelets: 215 10*3/uL (ref 150–450)
Potassium: 4.3 mmol/L (ref 3.5–5.2)
RBC: 4.84 x10E6/uL (ref 4.14–5.80)
RDW: 12.9 % (ref 11.6–15.4)
Sodium: 142 mmol/L (ref 134–144)
T3 Uptake Ratio: 28 % (ref 24–39)
T4, Total: 5.3 ug/dL (ref 4.5–12.0)
TSH: 2.31 u[IU]/mL (ref 0.450–4.500)
Total Protein: 7.2 g/dL (ref 6.0–8.5)
Triglycerides: 141 mg/dL (ref 0–149)
Uric Acid: 6.6 mg/dL (ref 3.8–8.4)
VLDL Cholesterol Cal: 26 mg/dL (ref 5–40)
WBC: 4.7 10*3/uL (ref 3.4–10.8)

## 2020-06-10 ENCOUNTER — Encounter: Payer: Self-pay | Admitting: Physician Assistant

## 2020-07-21 DIAGNOSIS — H5213 Myopia, bilateral: Secondary | ICD-10-CM | POA: Diagnosis not present

## 2022-11-05 ENCOUNTER — Ambulatory Visit: Payer: Self-pay | Admitting: Physician Assistant

## 2022-11-05 ENCOUNTER — Ambulatory Visit: Payer: Self-pay

## 2022-11-05 ENCOUNTER — Encounter: Payer: Self-pay | Admitting: Physician Assistant

## 2022-11-05 VITALS — BP 130/90 | HR 104 | Temp 98.4°F | Resp 16 | Ht 72.0 in | Wt 238.0 lb

## 2022-11-05 DIAGNOSIS — Z021 Encounter for pre-employment examination: Secondary | ICD-10-CM

## 2022-11-05 LAB — POCT URINALYSIS DIPSTICK
Bilirubin, UA: NEGATIVE
Blood, UA: NEGATIVE
Glucose, UA: NEGATIVE
Ketones, UA: NEGATIVE
Leukocytes, UA: NEGATIVE
Nitrite, UA: NEGATIVE
Protein, UA: POSITIVE — AB
Spec Grav, UA: 1.025 (ref 1.010–1.025)
Urobilinogen, UA: 0.2 E.U./dL
pH, UA: 6 (ref 5.0–8.0)

## 2022-11-05 NOTE — Progress Notes (Unsigned)
Labs, PFT, Hearing & Vision Screen orders were put in on  Durward Parcel, PAC's encounter.  Pre-employment drug screen completed. [facility] Acct #: [facility] Specimen #: Rapid drug screen results = Negative

## 2022-11-05 NOTE — Progress Notes (Signed)
[employer] [place] occupational health clinic   ____________________________________________   None    (approximate)  I have reviewed the triage vital signs and the nursing notes.   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 male patient presents for preemployment physical for [employer].  Patient voices no concerns or complaints.         Past Medical History:  Diagnosis Date   Low serum testosterone level     Patient Active Problem List   Diagnosis Date Noted   GERD (gastroesophageal reflux disease) 04/05/2020   Anxiety 04/05/2020   Elevated lipids 04/05/2020   Post concussion syndrome 10/08/2018   Acute appendicitis 05/26/2014   Routine health maintenance 11/26/2013    Past Surgical History:  Procedure Laterality Date   HERNIA REPAIR Right inguinal   distant   LAPAROSCOPIC APPENDECTOMY N/A 05/26/2014   Procedure: APPENDECTOMY LAPAROSCOPIC;  Surgeon: Adolph Pollack, MD;  Location: [facility];  Service: General;  Laterality: N/A;    Prior to Admission medications   Medication Sig Start Date End Date Taking? Authorizing Provider  omeprazole (PRILOSEC) 10 MG capsule Take by mouth.   Yes [provider]    Allergies Patient has no known allergies.  History reviewed. No pertinent family history.  Social History Social History   Tobacco Use   Smoking status: Former    Packs/day: 1.00    Types: Cigarettes   Smokeless tobacco: Current    Types: Snuff  Vaping Use   Vaping Use: Every day  Substance Use Topics   Alcohol use: Yes    Comment: occ   Drug use: No    Review of Systems Constitutional: No fever/chills Eyes: No visual changes. ENT: No sore throat. Cardiovascular: Denies chest pain. Respiratory: Denies shortness of breath. Gastrointestinal: No abdominal pain.  No nausea, no vomiting.  No diarrhea.  No constipation.  GERD Genitourinary: Negative for  dysuria. Musculoskeletal: Negative for back pain. Skin: Negative for rash. Neurological: Negative for headaches, focal weakness or numbness. Psychiatric: Anxiety  ____________________________________________   PHYSICAL EXAM:  VITAL SIGNS: BP is 130/90, pulse 104, respirations 16, temperature 98.4, patient 98% O2 sat on room air.  Patient weighs 238 pounds and BMI is 32.28. Constitutional: Alert and oriented. Well appearing and in no acute distress. Eyes: Conjunctivae are normal. PERRL. EOMI. Head: Atraumatic. Nose: No congestion/rhinnorhea. Mouth/Throat: Mucous membranes are moist.  Oropharynx non-erythematous. Neck: No stridor.  No cervical spine tenderness to palpation. Hematological/Lymphatic/Immunilogical: No cervical lymphadenopathy. Cardiovascular: Normal rate, regular rhythm. Grossly normal heart sounds.  Good peripheral circulation. Respiratory: Normal respiratory effort.  No retractions. Lungs CTAB. Gastrointestinal: Soft and nontender. No distention. No abdominal bruits. No CVA tenderness. Genitourinary: Deferred Musculoskeletal: No lower extremity tenderness nor edema.  No joint effusions. Neurologic:  Normal speech and language. No gross focal neurologic deficits are appreciated. No gait instability. Skin:  Skin is warm, dry and intact. No rash noted. Psychiatric: Mood and affect are normal. Speech and behavior are normal.  ____________________________________________   LABS Pending ____________________________________________      ____________________________________________   INITIAL IMPRESSION / ASSESSMENT AND PLAN As part of my medical decision making, I reviewed the following data within the electronic MEDICAL RECORD NUMBER      Discussed no acute findings on physical exam.  Labs are pending.        ____________________________________________   FINAL CLINICAL IMPRESSION(S) / ED DIAGNOSES  Well exam   ED Discharge Orders  Ordered     CMP12+LP+TP+TSH+6AC+CBC/D/Plt        11/05/22 1343    POCT urinalysis dipstick        11/05/22 1343    Hearing screening        11/05/22 1343    Vision screening        11/05/22 1343    Spirometry with graph        11/05/22 1343             Note:  This document was prepared using Dragon voice recognition software and may include unintentional dictation errors.

## 2022-11-06 LAB — CMP12+LP+TP+TSH+6AC+CBC/D/PLT
ALT: 21 IU/L (ref 0–44)
AST: 21 IU/L (ref 0–40)
Albumin/Globulin Ratio: 1.9 (ref 1.2–2.2)
Albumin: 5.2 g/dL (ref 4.3–5.2)
Alkaline Phosphatase: 81 IU/L (ref 44–121)
BUN/Creatinine Ratio: 15 (ref 9–20)
BUN: 15 mg/dL (ref 6–20)
Basophils Absolute: 0.1 10*3/uL (ref 0.0–0.2)
Basos: 1 %
Bilirubin Total: 0.4 mg/dL (ref 0.0–1.2)
Calcium: 10.4 mg/dL — ABNORMAL HIGH (ref 8.7–10.2)
Chloride: 100 mmol/L (ref 96–106)
Chol/HDL Ratio: 5.9 ratio — ABNORMAL HIGH (ref 0.0–5.0)
Cholesterol, Total: 254 mg/dL — ABNORMAL HIGH (ref 100–199)
Creatinine, Ser: 1.01 mg/dL (ref 0.76–1.27)
EOS (ABSOLUTE): 0.2 10*3/uL (ref 0.0–0.4)
Eos: 3 %
Estimated CHD Risk: 1.2 times avg. — ABNORMAL HIGH (ref 0.0–1.0)
Free Thyroxine Index: 1.9 (ref 1.2–4.9)
GGT: 28 IU/L (ref 0–65)
Globulin, Total: 2.7 g/dL (ref 1.5–4.5)
Glucose: 83 mg/dL (ref 70–99)
HDL: 43 mg/dL (ref >39)
Hematocrit: 46 % (ref 37.5–51.0)
Hemoglobin: 15.5 g/dL (ref 13.0–17.7)
Immature Grans (Abs): 0.1 10*3/uL (ref 0.0–0.1)
Immature Granulocytes: 1 %
Iron: 73 ug/dL (ref 38–169)
LDH: 182 IU/L (ref 121–224)
LDL Chol Calc (NIH): 178 mg/dL — ABNORMAL HIGH (ref 0–99)
Lymphocytes Absolute: 1.9 10*3/uL (ref 0.7–3.1)
Lymphs: 22 %
MCH: 29.5 pg (ref 26.6–33.0)
MCHC: 33.7 g/dL (ref 31.5–35.7)
MCV: 88 fL (ref 79–97)
Monocytes Absolute: 0.6 10*3/uL (ref 0.1–0.9)
Monocytes: 7 %
Neutrophils Absolute: 5.6 10*3/uL (ref 1.4–7.0)
Neutrophils: 66 %
Phosphorus: 4 mg/dL (ref 2.8–4.1)
Platelets: 289 10*3/uL (ref 150–450)
Potassium: 4.4 mmol/L (ref 3.5–5.2)
RBC: 5.26 x10E6/uL (ref 4.14–5.80)
RDW: 12.8 % (ref 11.6–15.4)
Sodium: 141 mmol/L (ref 134–144)
T3 Uptake Ratio: 27 % (ref 24–39)
T4, Total: 6.9 ug/dL (ref 4.5–12.0)
TSH: 3.19 u[IU]/mL (ref 0.450–4.500)
Total Protein: 7.9 g/dL (ref 6.0–8.5)
Triglycerides: 178 mg/dL — ABNORMAL HIGH (ref 0–149)
Uric Acid: 7.2 mg/dL (ref 3.8–8.4)
VLDL Cholesterol Cal: 33 mg/dL (ref 5–40)
WBC: 8.4 10*3/uL (ref 3.4–10.8)
eGFR: 103 mL/min/{1.73_m2} (ref >59)

## 2022-11-07 LAB — HEPATITIS B SURFACE ANTIBODY,QUALITATIVE: Hep B Surface Ab, Qual: REACTIVE

## 2023-03-05 ENCOUNTER — Ambulatory Visit: Payer: Self-pay | Admitting: Physician Assistant

## 2023-03-05 ENCOUNTER — Encounter: Payer: Self-pay | Admitting: Physician Assistant

## 2023-03-05 VITALS — BP 129/93 | HR 66 | Temp 98.2°F | Resp 12 | Ht 71.0 in | Wt 238.0 lb

## 2023-03-05 DIAGNOSIS — R5383 Other fatigue: Secondary | ICD-10-CM

## 2023-03-05 DIAGNOSIS — E782 Mixed hyperlipidemia: Secondary | ICD-10-CM

## 2023-03-05 NOTE — Progress Notes (Signed)
   Subjective: Fatigue    Patient ID: EMARION BABINEAU, male    DOB: 09-Jul-1991, 32 y.o.   MRN: [id number]  HPI Patient presents with chief complaint of fatigue. Reports a history of low testosterone that required medication however he never took the medication for concerns for sterility due to his age. Patient reports getting 8 hours of sleep every night but requiring additional sleep during the day. Also reports exercise 3x/week and a balanced diet.    Review of Systems General: low energy level, adequate sleep, no changes in appetite, no unexplained weight gain or loss     Objective:   Physical Exam  Exam deferred   VS: BP 129/93, P 66, T 98.31F, SpO2 99%, WT 238lb, BMI 33.2      Assessment & Plan:    Discussed obtaining testosterone level, B12, vit D to as well as rechecking lipid panel due to it being elevated in Nov 2023. Plan to f/u on Monday to review labwork.

## 2023-03-05 NOTE — Progress Notes (Signed)
5-6 years of fatigue, pt has hx of testicular pain dx with low testosterone in 2014-15. Pt had ultra sound but no evidence for the pain, Dr told pt he could of had torsion that worked it self out. Pt was never put on anything for low testosterone due to young age. Was offered gel but did not do it do to trying to have kids.  Pt also states he works out 3 times a week changed his diet for the past 6 months and nothing has changed the fatigue.

## 2023-03-07 LAB — LIPID PANEL
Chol/HDL Ratio: 5.8 ratio — ABNORMAL HIGH (ref 0.0–5.0)
Cholesterol, Total: 227 mg/dL — ABNORMAL HIGH (ref 100–199)
HDL: 39 mg/dL — ABNORMAL LOW (ref >39)
LDL Chol Calc (NIH): 172 mg/dL — ABNORMAL HIGH (ref 0–99)
Triglycerides: 91 mg/dL (ref 0–149)
VLDL Cholesterol Cal: 16 mg/dL (ref 5–40)

## 2023-03-07 LAB — B12 AND FOLATE PANEL
Folate: 4.9 ng/mL (ref >3.0)
Vitamin B-12: 447 pg/mL (ref 232–1245)

## 2023-03-07 LAB — TESTOSTERONE,FREE AND TOTAL
Testosterone, Free: 7.8 pg/mL — ABNORMAL LOW (ref 9.3–26.5)
Testosterone: 275 ng/dL (ref 264–916)

## 2023-03-07 LAB — VITAMIN D 25 HYDROXY (VIT D DEFICIENCY, FRACTURES): Vit D, 25-Hydroxy: 16 ng/mL — ABNORMAL LOW (ref 30.0–100.0)

## 2023-03-07 LAB — TSH+FREE T4
Free T4: 1.25 ng/dL (ref 0.82–1.77)
TSH: 1.78 u[IU]/mL (ref 0.450–4.500)

## 2023-03-11 ENCOUNTER — Encounter: Payer: Self-pay | Admitting: Physician Assistant

## 2023-03-11 ENCOUNTER — Ambulatory Visit: Payer: Self-pay | Admitting: Physician Assistant

## 2023-03-11 VITALS — BP 130/91 | HR 82 | Temp 97.7°F | Resp 12

## 2023-03-11 DIAGNOSIS — R7989 Other specified abnormal findings of blood chemistry: Secondary | ICD-10-CM

## 2023-03-11 DIAGNOSIS — R5383 Other fatigue: Secondary | ICD-10-CM

## 2023-03-11 MED ORDER — VITAMIN D (ERGOCALCIFEROL) 1.25 MG (50000 UNIT) PO CAPS: 50000.0000 [IU] | ORAL_CAPSULE | ORAL | 3 refills | Status: DC

## 2023-03-11 NOTE — Progress Notes (Signed)
   Subjective: Fatigue    Patient ID: Travis Cervantes, male    DOB: [birth year], 32 y.o.   MRN: [id number]  HPI Presents for follow up for fatigue. Labs obtained last week reviewed with patient.    Review of Systems Fatigue    Objective:   Physical Exam Deferred        Assessment & Plan:   Lab work evaluated with patient. Discussed low testosterone and urology referral made. Also vitamin D level low, vitamin D supplement ordered. Lipid panel slightly elevated, educated on lifestyle modifications and diet. Will f/u in 3 months.

## 2023-03-11 NOTE — Addendum Note (Signed)
Addended by: Aliene Altes on: 03/11/2023 11:14 AM   Modules accepted: Orders

## 2023-03-11 NOTE — Progress Notes (Signed)
Urology consult put in Epic for [facility] - Dr. Diamantina Providence.  AMD

## 2023-03-22 ENCOUNTER — Ambulatory Visit: Payer: Self-pay | Admitting: Urology

## 2023-03-27 ENCOUNTER — Encounter: Payer: Self-pay | Admitting: Urology

## 2023-03-27 ENCOUNTER — Ambulatory Visit: Payer: 59 | Admitting: Urology

## 2023-03-27 VITALS — BP 118/80 | HR 73 | Ht 71.0 in | Wt 238.0 lb

## 2023-03-27 DIAGNOSIS — E291 Testicular hypofunction: Secondary | ICD-10-CM | POA: Diagnosis not present

## 2023-03-27 DIAGNOSIS — R7989 Other specified abnormal findings of blood chemistry: Secondary | ICD-10-CM

## 2023-03-27 NOTE — Patient Instructions (Signed)

## 2023-03-27 NOTE — Progress Notes (Signed)
   03/27/23 2:43 PM   Travis Cervantes May 05, 1991 [id number]  CC: Low testosterone  HPI: 32 year old male with long history of low testosterone as far back as 2015.  Recent testosterone was checked and was low at 275.  He has a history of hypospadias.  He has 2 children, his wife had her tubes tied and they are not interested in further biologic pregnancies.  He reports chronic fatigue for at least 4 to 5 years, some mild problems with maintaining erections.  His wife says he snores but has never been evaluated for sleep apnea.  Recent vitamin D level was very low at 16 and he was started on supplementation.  He reports he tried working out and changing his diet for 6 to 8 months without any changes in his fatigue.  He also was tried on an SSRI with no improvement.  He was evaluated for low testosterone back in 2015 and testosterone was low at 206, but on repeat increased to 356.  LH was low normal at 2.4, FSH normal at 4.9, PSA 1.8.  He never started any treatment at that time.  He also reportedly had a scrotal ultrasound at that time that was normal.  PMH: Past Medical History:  Diagnosis Date   Low serum testosterone level     Surgical History: Past Surgical History:  Procedure Laterality Date   HERNIA REPAIR Right inguinal   distant   LAPAROSCOPIC APPENDECTOMY N/A 05/26/2014   Procedure: APPENDECTOMY LAPAROSCOPIC;  Surgeon: Adolph Pollack, MD;  Location: [facility];  Service: General;  Laterality: N/A;     Family History: No family history on file.  Social History:  reports that he has quit smoking. His smoking use included cigarettes. He smoked an average of 1 pack per day. He has been exposed to tobacco smoke. His smokeless tobacco use includes snuff. He reports current alcohol use. He reports that he does not use drugs.  Physical Exam: BP 118/80   Pulse 73   Ht 5\' 11"  (1.803 m)   Wt 238 lb (108 kg)   BMI 33.19 kg/m    Constitutional:  Alert and oriented, No acute  distress. Cardiovascular: No clubbing, cyanosis, or edema. Respiratory: Normal respiratory effort, no increased work of breathing. GI: Abdomen is soft, nontender, nondistended, no abdominal masses GU: Phallus with hypospadias midshaft, testicles 20 cc and descended bilaterally without masses  Laboratory Data: Reviewed, see HPI   Assessment & Plan:   32 year old male with a long history of low testosterone for at least 10 years, recent low level of 275 with chronic fatigue.  He also has a history of hypospadias.  We reviewed the AUA guidelines regarding evaluation and management of patients with low testosterone, and I recommended starting with a testosterone, LH, and prolactin with his history of low normal LH.  We discussed possible options including testosterone replacement, off label Clomid, or possible need for brain imaging pending the prolactin result.  I also recommended continuing the vitamin D prescribed by PCP.  Testosterone, LH, prolactin(will check in around 2 weeks after has been on vitamin D supplementation for 1 month) Call with results, consider Clomid or testosterone replaced and if testosterone remains low   Legrand Rams, MD 03/27/2023  [facility] [address] [place], [place] [address] [phone]

## 2023-04-01 ENCOUNTER — Other Ambulatory Visit
Admission: RE | Admit: 2023-04-01 | Discharge: 2023-04-01 | Disposition: A | Discharge status: Home / Self Care | Payer: 59 | Attending: Urology | Admitting: Urology

## 2023-04-01 DIAGNOSIS — R7989 Other specified abnormal findings of blood chemistry: Secondary | ICD-10-CM | POA: Insufficient documentation

## 2023-04-04 ENCOUNTER — Other Ambulatory Visit: Payer: Self-pay

## 2023-04-04 DIAGNOSIS — R7989 Other specified abnormal findings of blood chemistry: Secondary | ICD-10-CM

## 2023-04-04 LAB — PROLACTIN: Prolactin: 8.9 ng/mL (ref 3.6–31.5)

## 2023-04-04 LAB — LUTEINIZING HORMONE: LH: 4.1 m[IU]/mL (ref 1.7–8.6)

## 2023-04-04 LAB — TESTOSTERONE: Testosterone: 308 ng/dL (ref 264–916)

## 2023-04-04 NOTE — Addendum Note (Signed)
Addended by: Sueanne Margarita on: 04/04/2023 03:15 PM   Modules accepted: Orders

## 2023-05-14 ENCOUNTER — Ambulatory Visit: Payer: 59 | Admitting: Urology

## 2023-05-20 ENCOUNTER — Other Ambulatory Visit: Payer: 59

## 2023-05-27 ENCOUNTER — Other Ambulatory Visit
Admission: RE | Admit: 2023-05-27 | Discharge: 2023-05-27 | Disposition: A | Discharge status: Home / Self Care | Payer: 59 | Attending: Urology | Admitting: Urology

## 2023-05-27 DIAGNOSIS — R7989 Other specified abnormal findings of blood chemistry: Secondary | ICD-10-CM | POA: Insufficient documentation

## 2023-05-29 LAB — TESTOSTERONE: Testosterone: 361 ng/dL (ref 264–916)

## 2023-06-11 ENCOUNTER — Other Ambulatory Visit: Payer: Self-pay

## 2023-06-11 DIAGNOSIS — E782 Mixed hyperlipidemia: Secondary | ICD-10-CM

## 2023-06-18 ENCOUNTER — Ambulatory Visit: Payer: 59 | Admitting: Urology

## 2023-06-27 DIAGNOSIS — F411 Generalized anxiety disorder: Secondary | ICD-10-CM | POA: Diagnosis not present

## 2023-07-07 ENCOUNTER — Emergency Department: Payer: 59

## 2023-07-07 ENCOUNTER — Other Ambulatory Visit: Payer: Self-pay

## 2023-07-07 DIAGNOSIS — R002 Palpitations: Secondary | ICD-10-CM | POA: Insufficient documentation

## 2023-07-07 DIAGNOSIS — R0789 Other chest pain: Secondary | ICD-10-CM | POA: Diagnosis not present

## 2023-07-07 DIAGNOSIS — R079 Chest pain, unspecified: Secondary | ICD-10-CM | POA: Diagnosis not present

## 2023-07-07 DIAGNOSIS — R457 State of emotional shock and stress, unspecified: Secondary | ICD-10-CM | POA: Diagnosis not present

## 2023-07-07 LAB — CBC
HCT: 41 % (ref 39.0–52.0)
Hemoglobin: 14.6 g/dL (ref 13.0–17.0)
MCH: 29.4 pg (ref 26.0–34.0)
MCHC: 35.6 g/dL (ref 30.0–36.0)
MCV: 82.7 fL (ref 80.0–100.0)
Platelets: 233 10*3/uL (ref 150–400)
RBC: 4.96 MIL/uL (ref 4.22–5.81)
RDW: 12.9 % (ref 11.5–15.5)
WBC: 7 10*3/uL (ref 4.0–10.5)
nRBC: 0 % (ref 0.0–0.2)

## 2023-07-07 LAB — BASIC METABOLIC PANEL
Anion gap: 7 (ref 5–15)
BUN: 16 mg/dL (ref 6–20)
CO2: 24 mmol/L (ref 22–32)
Calcium: 9.2 mg/dL (ref 8.9–10.3)
Chloride: 107 mmol/L (ref 98–111)
Creatinine, Ser: 1.04 mg/dL (ref 0.61–1.24)
GFR, Estimated: >60 mL/min (ref >60)
Glucose, Bld: 169 mg/dL — ABNORMAL HIGH (ref 70–99)
Potassium: 3.4 mmol/L — ABNORMAL LOW (ref 3.5–5.1)
Sodium: 138 mmol/L (ref 135–145)

## 2023-07-07 LAB — TROPONIN I (HIGH SENSITIVITY): Troponin I (High Sensitivity): 4 ng/L (ref <18)

## 2023-07-07 NOTE — ED Triage Notes (Signed)
Pt to ed from home via [facility] for "heart racing and crushing CP". Pt has HX of anxiety. Pt was taking sertaline and then stopped and then started it again. Pt is caox4, in no acute distress and in wheel chair in triage.

## 2023-07-07 NOTE — ED Notes (Signed)
First Nurse Note: Pt arrives via [facility] from home with complaints of CP with associated anxiety. Recently restarted his anxiety medication. Endorses heaviness across his chest.  VSS with EMS EKG- unremarkable

## 2023-07-08 ENCOUNTER — Emergency Department
Admission: EM | Admit: 2023-07-08 | Discharge: 2023-07-08 | Disposition: A | Discharge status: Home / Self Care | Payer: 59 | Attending: Emergency Medicine | Admitting: Emergency Medicine

## 2023-07-08 DIAGNOSIS — R002 Palpitations: Secondary | ICD-10-CM

## 2023-07-08 DIAGNOSIS — R079 Chest pain, unspecified: Secondary | ICD-10-CM

## 2023-07-08 LAB — TROPONIN I (HIGH SENSITIVITY): Troponin I (High Sensitivity): 5 ng/L (ref <18)

## 2023-07-08 NOTE — ED Provider Notes (Signed)
[facility] Provider Note    Event Date/Time   First MD Initiated Contact with Patient 07/08/23 0126     (approximate)   History   Chief Complaint Chest Pain (Sudden onset)   HPI  Travis Cervantes is a 32 y.o. male with past medical history of GERD and anxiety who presents to the ED complaining of chest pain.  Patient reports that just prior to arrival he had sudden onset of palpitations and feeling like his heart was racing associated with "crushing" chest pain.  He states that the pain occurred while he was walking outside to do some yard work, denies any associated difficulty breathing.  Symptoms seem to settle down after a few minutes, but shortly afterwards had return of similar symptoms.  EMS was called and patient reports that symptoms resolved during transport.  He reports 1 prior episode of similar symptoms 1 week ago that was associated with some difficulty breathing, did not have any difficulty breathing today.  He has not had any recent fevers or cough, denies any pain or swelling in his legs.     Physical Exam   Triage Vital Signs: ED Triage Vitals  Encounter Vitals Group     BP 07/07/23 2210 (!) 142/96     Systolic BP Percentile --      Diastolic BP Percentile --      Pulse Rate 07/07/23 2210 88     Resp 07/07/23 2210 16     Temp 07/07/23 2210 98 F (36.7 C)     Temp Source 07/07/23 2210 Oral     SpO2 07/07/23 2203 98 %     Weight 07/07/23 2207 235 lb (106.6 kg)     Height 07/07/23 2207 5\' 11"  (1.803 m)     Head Circumference --      Peak Flow --      Pain Score 07/07/23 2207 5     Pain Loc --      Pain Education --      Exclude from Growth Chart --     Most recent vital signs: Vitals:   07/07/23 2210 07/08/23 0213  BP: (!) 142/96 121/82  Pulse: 88 (!) 56  Resp: 16 18  Temp: 98 F (36.7 C) 98.1 F (36.7 C)  SpO2: 98% 99%    Constitutional: Alert and oriented. Eyes: Conjunctivae are normal. Head: Atraumatic. Nose: No  congestion/rhinnorhea. Mouth/Throat: Mucous membranes are moist.  Cardiovascular: Normal rate, regular rhythm. Grossly normal heart sounds.  2+ radial pulses bilaterally. Respiratory: Normal respiratory effort.  No retractions. Lungs CTAB. Gastrointestinal: Soft and nontender. No distention. Musculoskeletal: No lower extremity tenderness nor edema.  Neurologic:  Normal speech and language. No gross focal neurologic deficits are appreciated.    ED Results / Procedures / Treatments   Labs (all labs ordered are listed, but only abnormal results are displayed) Labs Reviewed  BASIC METABOLIC PANEL - Abnormal; Notable for the following components:      Result Value   Potassium 3.4 (*)    Glucose, Bld 169 (*)    All other components within normal limits  CBC  TROPONIN I (HIGH SENSITIVITY)  TROPONIN I (HIGH SENSITIVITY)     EKG  ED ECG REPORT I, Chesley Noon, the attending physician, personally viewed and interpreted this ECG.   Date: 07/08/2023  EKG Time: 22:0-8  Rate: 84  Rhythm: normal sinus rhythm  Axis: Normal  Intervals:none  ST&T Change: None  RADIOLOGY Chest x-ray reviewed and interpreted by me with  no infiltrate, edema, or effusion.  PROCEDURES:  Critical Care performed: No  Procedures   MEDICATIONS ORDERED IN ED: Medications - No data to display   IMPRESSION / MDM / ASSESSMENT AND PLAN / ED COURSE  I reviewed the triage vital signs and the nursing notes.                              32 y.o. male with past medical history of GERD and anxiety presents to the ED with intermittent palpitations and chest discomfort, now resolved.  Patient's presentation is most consistent with acute presentation with potential threat to life or bodily function.  Differential diagnosis includes, but is not limited to, arrhythmia, ACS, PE, pneumonia, pneumothorax, musculoskeletal pain, GERD, anemia, electrolyte abnormality, anxiety.  Patient nontoxic-appearing and in no  acute distress, vital signs are unremarkable.  EKG shows no evidence of arrhythmia or ischemia and initial troponin within normal limits.  Chest x-ray is unremarkable and remainder of labs are reassuring with no significant anemia, leukocytosis, tract abnormality, or AKI.  We will observe on cardiac monitor and check repeat troponin.  Low suspicion for PE or dissection given resolved symptoms with reassuring vital signs.  Repeat troponin within normal limits, no events noted on cardiac monitor.  Patient remains asymptomatic at this time and he is appropriate for discharge home with outpatient cardiology follow-up.  He was counseled to return to the ED for new or worsening symptoms, patient agrees with plan.      FINAL CLINICAL IMPRESSION(S) / ED DIAGNOSES   Final diagnoses:  Nonspecific chest pain  Palpitations     Rx / DC Orders   ED Discharge Orders          Ordered    Ambulatory referral to Cardiology        07/08/23 0333             Note:  This document was prepared using Dragon voice recognition software and may include unintentional dictation errors.   Chesley Noon, MD 07/08/23 [phone]

## 2023-07-11 DIAGNOSIS — Z20822 Contact with and (suspected) exposure to covid-19: Secondary | ICD-10-CM | POA: Diagnosis not present

## 2023-07-11 DIAGNOSIS — F17220 Nicotine dependence, chewing tobacco, uncomplicated: Secondary | ICD-10-CM | POA: Diagnosis not present

## 2023-07-11 DIAGNOSIS — F17290 Nicotine dependence, other tobacco product, uncomplicated: Secondary | ICD-10-CM | POA: Diagnosis not present

## 2023-07-11 DIAGNOSIS — Z720 Tobacco use: Secondary | ICD-10-CM | POA: Diagnosis not present

## 2023-07-11 DIAGNOSIS — R001 Bradycardia, unspecified: Secondary | ICD-10-CM | POA: Diagnosis not present

## 2023-07-11 DIAGNOSIS — R002 Palpitations: Secondary | ICD-10-CM | POA: Diagnosis not present

## 2023-07-11 DIAGNOSIS — R0602 Shortness of breath: Secondary | ICD-10-CM | POA: Diagnosis not present

## 2023-07-11 DIAGNOSIS — R0609 Other forms of dyspnea: Secondary | ICD-10-CM | POA: Diagnosis not present

## 2023-07-11 DIAGNOSIS — R0789 Other chest pain: Secondary | ICD-10-CM | POA: Diagnosis not present

## 2023-07-11 DIAGNOSIS — R079 Chest pain, unspecified: Secondary | ICD-10-CM | POA: Diagnosis not present

## 2023-07-20 DIAGNOSIS — R079 Chest pain, unspecified: Secondary | ICD-10-CM | POA: Diagnosis not present

## 2023-07-20 DIAGNOSIS — R002 Palpitations: Secondary | ICD-10-CM | POA: Diagnosis not present

## 2023-07-31 DIAGNOSIS — F411 Generalized anxiety disorder: Secondary | ICD-10-CM | POA: Diagnosis not present

## 2023-08-06 DIAGNOSIS — R0789 Other chest pain: Secondary | ICD-10-CM | POA: Diagnosis not present

## 2023-08-06 DIAGNOSIS — R002 Palpitations: Secondary | ICD-10-CM | POA: Diagnosis not present

## 2023-08-06 DIAGNOSIS — R0609 Other forms of dyspnea: Secondary | ICD-10-CM | POA: Diagnosis not present

## 2023-08-12 DIAGNOSIS — R002 Palpitations: Secondary | ICD-10-CM | POA: Diagnosis not present

## 2023-08-13 ENCOUNTER — Ambulatory Visit: Payer: PRIVATE HEALTH INSURANCE | Admitting: Cardiovascular Disease

## 2023-08-22 DIAGNOSIS — R0789 Other chest pain: Secondary | ICD-10-CM | POA: Diagnosis not present

## 2023-08-22 DIAGNOSIS — R0609 Other forms of dyspnea: Secondary | ICD-10-CM | POA: Diagnosis not present

## 2023-08-22 DIAGNOSIS — R002 Palpitations: Secondary | ICD-10-CM | POA: Diagnosis not present

## 2023-08-22 DIAGNOSIS — R9439 Abnormal result of other cardiovascular function study: Secondary | ICD-10-CM | POA: Diagnosis not present

## 2023-08-22 DIAGNOSIS — I360 Nonrheumatic tricuspid (valve) stenosis: Secondary | ICD-10-CM | POA: Diagnosis not present

## 2023-09-18 ENCOUNTER — Ambulatory Visit: Payer: 59 | Admitting: Urology

## 2023-09-18 ENCOUNTER — Encounter: Payer: Self-pay | Admitting: Urology

## 2023-09-18 ENCOUNTER — Other Ambulatory Visit
Admission: RE | Admit: 2023-09-18 | Discharge: 2023-09-18 | Disposition: A | Discharge status: Home / Self Care | Payer: 59 | Attending: Urology | Admitting: Urology

## 2023-09-18 VITALS — BP 132/81 | HR 59 | Ht 71.0 in | Wt 237.6 lb

## 2023-09-18 DIAGNOSIS — R7989 Other specified abnormal findings of blood chemistry: Secondary | ICD-10-CM | POA: Insufficient documentation

## 2023-09-18 DIAGNOSIS — E291 Testicular hypofunction: Secondary | ICD-10-CM | POA: Diagnosis not present

## 2023-09-18 NOTE — Progress Notes (Signed)
   09/18/2023 8:48 AM   Alycia Patten 21-Jul-1991 [id number]  Reason for visit: Follow up borderline testosterone levels, fatigue, anxiety  HPI: 32 year old male with long history of low testosterone as far back as 2015.  He has 2 children, his wife has had her tubes tied and are not interested in further biologic pregnancies.  When I originally met him in April 2024 he reported 4 to 5 years of chronic fatigue, vitamin D levels were very low at 16 and he was started on supplementation.  Testosterone levels improved with vitamin D supplementation including 308 in April 2024 and 361 in June 2024.  He feels like he continues to have fatigue, as well as some new anxiety over the last few months and difficulty losing weight.  He wonders if this is related to testosterone levels.  We discussed the new onset of monitoring testosterone levels, and that anxiety and fatigue can be multifactorial.  Using shared decision making we opted for a testosterone level today, and if less than 350 could consider a trial of Clomid to see if increasing his testosterone levels decreases his anxiety and fatigue.  He also had some questions about urinary frequency today and we discussed avoiding bladder irritants, I offered a urinalysis today but he deferred.  Testosterone today, will send MyChart with results, if less than 350 will consider trial of Clomid  Sondra Come, MD  [facility] [address], [address] [place], [place] [address] [phone]

## 2023-09-19 LAB — TESTOSTERONE: Testosterone: 287 ng/dL (ref 264–916)

## 2023-09-20 ENCOUNTER — Other Ambulatory Visit: Payer: Self-pay

## 2023-09-20 DIAGNOSIS — R7989 Other specified abnormal findings of blood chemistry: Secondary | ICD-10-CM

## 2023-09-20 MED ORDER — CLOMIPHENE CITRATE 50 MG PO TABS
25.0000 mg | ORAL_TABLET | Freq: Every day | ORAL | 2 refills | Status: DC
Start: 2023-09-20 — End: 2023-10-30

## 2023-09-20 NOTE — Telephone Encounter (Signed)
Lab ordered. Appt scheduled.

## 2023-09-24 ENCOUNTER — Ambulatory Visit: Payer: Self-pay

## 2023-09-24 DIAGNOSIS — Z0000 Encounter for general adult medical examination without abnormal findings: Secondary | ICD-10-CM

## 2023-09-24 LAB — POCT URINALYSIS DIPSTICK
Bilirubin, UA: NEGATIVE
Blood, UA: NEGATIVE
Glucose, UA: NEGATIVE
Ketones, UA: NEGATIVE
Leukocytes, UA: NEGATIVE
Nitrite, UA: NEGATIVE
Protein, UA: POSITIVE — AB
Spec Grav, UA: 1.015 (ref 1.010–1.025)
Urobilinogen, UA: 0.2 U/dL
pH, UA: 7 (ref 5.0–8.0)

## 2023-09-25 LAB — CMP12+LP+TP+TSH+6AC+CBC/D/PLT
ALT: 28 [IU]/L (ref 0–44)
AST: 31 [IU]/L (ref 0–40)
Albumin: 4.7 g/dL (ref 4.3–5.2)
Alkaline Phosphatase: 61 [IU]/L (ref 44–121)
BUN/Creatinine Ratio: 17 (ref 9–20)
BUN: 17 mg/dL (ref 6–20)
Basophils Absolute: 0 10*3/uL (ref 0.0–0.2)
Basos: 1 %
Bilirubin Total: 0.6 mg/dL (ref 0.0–1.2)
Calcium: 9.5 mg/dL (ref 8.7–10.2)
Chloride: 103 mmol/L (ref 96–106)
Chol/HDL Ratio: 6.1 {ratio} — ABNORMAL HIGH (ref 0.0–5.0)
Cholesterol, Total: 250 mg/dL — ABNORMAL HIGH (ref 100–199)
Creatinine, Ser: 0.98 mg/dL (ref 0.76–1.27)
EOS (ABSOLUTE): 0.2 10*3/uL (ref 0.0–0.4)
Eos: 5 %
Estimated CHD Risk: 1.3 times avg. — ABNORMAL HIGH (ref 0.0–1.0)
Free Thyroxine Index: 1.9 (ref 1.2–4.9)
GGT: 19 [IU]/L (ref 0–65)
Globulin, Total: 2.5 g/dL (ref 1.5–4.5)
Glucose: 125 mg/dL — ABNORMAL HIGH (ref 70–99)
HDL: 41 mg/dL (ref >39)
Hematocrit: 42.1 % (ref 37.5–51.0)
Hemoglobin: 13.9 g/dL (ref 13.0–17.7)
Immature Grans (Abs): 0 10*3/uL (ref 0.0–0.1)
Immature Granulocytes: 0 %
Iron: 106 ug/dL (ref 38–169)
LDH: 151 [IU]/L (ref 121–224)
LDL Chol Calc (NIH): 188 mg/dL — ABNORMAL HIGH (ref 0–99)
Lymphocytes Absolute: 1.5 10*3/uL (ref 0.7–3.1)
Lymphs: 33 %
MCH: 29.4 pg (ref 26.6–33.0)
MCHC: 33 g/dL (ref 31.5–35.7)
MCV: 89 fL (ref 79–97)
Monocytes Absolute: 0.4 10*3/uL (ref 0.1–0.9)
Monocytes: 8 %
Neutrophils Absolute: 2.4 10*3/uL (ref 1.4–7.0)
Neutrophils: 53 %
Phosphorus: 3.1 mg/dL (ref 2.8–4.1)
Platelets: 194 10*3/uL (ref 150–450)
Potassium: 4.2 mmol/L (ref 3.5–5.2)
RBC: 4.73 x10E6/uL (ref 4.14–5.80)
RDW: 12.7 % (ref 11.6–15.4)
Sodium: 141 mmol/L (ref 134–144)
T3 Uptake Ratio: 30 % (ref 24–39)
T4, Total: 6.4 ug/dL (ref 4.5–12.0)
TSH: 1.78 u[IU]/mL (ref 0.450–4.500)
Total Protein: 7.2 g/dL (ref 6.0–8.5)
Triglycerides: 118 mg/dL (ref 0–149)
Uric Acid: 6.8 mg/dL (ref 3.8–8.4)
VLDL Cholesterol Cal: 21 mg/dL (ref 5–40)
WBC: 4.5 10*3/uL (ref 3.4–10.8)
eGFR: 106 mL/min/{1.73_m2} (ref >59)

## 2023-09-26 LAB — SPECIMEN STATUS REPORT

## 2023-09-26 LAB — HGB A1C W/O EAG: Hgb A1c MFr Bld: 5.5 % (ref 4.8–5.6)

## 2023-10-01 ENCOUNTER — Encounter: Payer: PRIVATE HEALTH INSURANCE | Admitting: Physician Assistant

## 2023-10-08 ENCOUNTER — Ambulatory Visit: Payer: Self-pay | Admitting: Physician Assistant

## 2023-10-08 ENCOUNTER — Encounter: Payer: Self-pay | Admitting: Physician Assistant

## 2023-10-08 VITALS — BP 125/66 | HR 78 | Temp 98.7°F | Resp 16 | Ht 71.0 in | Wt 238.0 lb

## 2023-10-08 DIAGNOSIS — Z0000 Encounter for general adult medical examination without abnormal findings: Secondary | ICD-10-CM

## 2023-10-08 NOTE — Progress Notes (Signed)
Pt presents today to complete physical, Pt will return tomorrow for redraw of labs due to not fasting.

## 2023-10-08 NOTE — Progress Notes (Signed)
[facility]  ________________________   None    (approximate)  I have reviewed the triage vital signs and the nursing notes.   HISTORY  Chief Complaint No chief complaint on file.   HPI Travis Cervantes is a 32 y.o. male patient presents for annual physical exam.  Patient relates that he did not fast and have breakfast prior to his labs.  Close no other concerns or complaints.         Past Medical History:  Diagnosis Date   Low serum testosterone level     Patient Active Problem List   Diagnosis Date Noted   GERD (gastroesophageal reflux disease) 04/05/2020   Anxiety 04/05/2020   Elevated lipids 04/05/2020   Post concussion syndrome 10/08/2018   Acute appendicitis 05/26/2014   Routine health maintenance 11/26/2013    Past Surgical History:  Procedure Laterality Date   HERNIA REPAIR Right inguinal   distant   LAPAROSCOPIC APPENDECTOMY N/A 05/26/2014   Procedure: APPENDECTOMY LAPAROSCOPIC;  Surgeon: Adolph Pollack, MD;  Location: [facility];  Service: General;  Laterality: N/A;    Prior to Admission medications   Medication Sig Start Date End Date Taking? Authorizing Provider  clomiPHENE (CLOMID) 50 MG tablet Take 0.5 tablets (25 mg total) by mouth daily. 09/20/23   Sondra Come, MD  hydrOXYzine (ATARAX) 10 MG tablet SMARTSIG:1-2 Tablet(s) By Mouth 1-2 Times Daily PRN 07/31/23   [provider]  omeprazole (PRILOSEC) 40 MG capsule Take 40 mg by mouth daily. 07/11/23   [provider]  sertraline (ZOLOFT) 50 MG tablet Take 50 mg by mouth daily. 08/30/23   [provider]  Vitamin D, Ergocalciferol, (DRISDOL) 1.25 MG (50000 UNIT) CAPS capsule Take 1 capsule (50,000 Units total) by mouth every 7 (seven) days. 03/11/23   Joni Reining, PA-C    Allergies Patient has no known allergies.  No family history on file.  Social History Social History   Tobacco Use   Smoking status: Former    Current  packs/day: 1.00    Types: Cigarettes    Passive exposure: Past   Smokeless tobacco: Current    Types: Snuff  Vaping Use   Vaping status: Every Day  Substance Use Topics   Alcohol use: Yes    Comment: occ   Drug use: No    Review of Systems Constitutional: No fever/chills Eyes: No visual changes. ENT: No sore throat. Cardiovascular: Denies chest pain. Respiratory: Denies shortness of breath. Gastrointestinal: No abdominal pain.  No nausea, no vomiting.  No diarrhea.  No constipation.  GERD Genitourinary: Negative for dysuria. Musculoskeletal: Negative for back pain. Skin: Negative for rash. Neurological: Negative for headaches, focal weakness or numbness. Psychiatric: Anxiety  ____________________________________________   PHYSICAL EXAM:  VITAL SIGNS: BP 125/66  Pulse 78  Resp 16  Temp 98.7 F (37.1 C)  Temp src Temporal  SpO2 97 %  Weight 238 lb (108 kg)  Height 5\' 11"  (1.803 m)   BMI 33.19 kg/m2  BSA 2.33 m2   Constitutional: Alert and oriented. Well appearing and in no acute distress. Eyes: Conjunctivae are normal. PERRL. EOMI. Head: Atraumatic. Nose: No congestion/rhinnorhea. Mouth/Throat: Mucous membranes are moist.  Oropharynx non-erythematous. Neck: No stridor.  No cervical spine tenderness to palpation. Hematological/Lymphatic/Immunilogical: No cervical lymphadenopathy. Cardiovascular: Normal rate, regular rhythm. Grossly normal heart sounds.  Good peripheral circulation. Respiratory: Normal respiratory effort.  No retractions. Lungs CTAB. Gastrointestinal: Soft and nontender. No distention. No abdominal bruits. No CVA tenderness. Genitourinary:  Deferred Musculoskeletal: No lower extremity tenderness nor edema.  No joint effusions. Neurologic:  Normal speech and language. No gross focal neurologic deficits are appreciated. No gait instability. Skin:  Skin is warm, dry and intact. No rash noted. Psychiatric: Mood and affect are normal. Speech and  behavior are normal.  ____________________________________________   LABS Repeat labs pending ____________________________________________  EKG  Sinus rhythm at 74 bpm ____________________________________________    ____________________________________________   INITIAL IMPRESSION / ASSESSMENT AND PLAN As part of my medical decision making, I reviewed the following data within the electronic MEDICAL RECORD NUMBER       No acute findings on physical exam and EKG.  Labs pending.     ____________________________________________   FINAL CLINICAL IMPRESSION Well exam    ED Discharge Orders     None        Note:  This document was prepared using Dragon voice recognition software and may include unintentional dictation errors.

## 2023-10-09 ENCOUNTER — Other Ambulatory Visit: Payer: PRIVATE HEALTH INSURANCE

## 2023-10-09 ENCOUNTER — Other Ambulatory Visit: Payer: Self-pay

## 2023-10-09 DIAGNOSIS — Z0000 Encounter for general adult medical examination without abnormal findings: Secondary | ICD-10-CM

## 2023-10-09 NOTE — Progress Notes (Signed)
Pt presents today to complete redraw of labs due to not fasting for previous apt.

## 2023-10-10 LAB — CMP12+LP+TP+TSH+6AC+CBC/D/PLT
ALT: 18 [IU]/L (ref 0–44)
AST: 19 [IU]/L (ref 0–40)
Albumin: 4.5 g/dL (ref 4.3–5.2)
Alkaline Phosphatase: 54 [IU]/L (ref 44–121)
BUN/Creatinine Ratio: 13 (ref 9–20)
BUN: 14 mg/dL (ref 6–20)
Basophils Absolute: 0 10*3/uL (ref 0.0–0.2)
Basos: 1 %
Bilirubin Total: 0.4 mg/dL (ref 0.0–1.2)
Calcium: 9 mg/dL (ref 8.7–10.2)
Chloride: 103 mmol/L (ref 96–106)
Chol/HDL Ratio: 5.7 ratio — ABNORMAL HIGH (ref 0.0–5.0)
Cholesterol, Total: 199 mg/dL (ref 100–199)
Creatinine, Ser: 1.09 mg/dL (ref 0.76–1.27)
EOS (ABSOLUTE): 0.2 10*3/uL (ref 0.0–0.4)
Eos: 4 %
Estimated CHD Risk: 1.2 times avg. — ABNORMAL HIGH (ref 0.0–1.0)
Free Thyroxine Index: 1.7 (ref 1.2–4.9)
GGT: 18 [IU]/L (ref 0–65)
Globulin, Total: 2.4 g/dL (ref 1.5–4.5)
Glucose: 95 mg/dL (ref 70–99)
HDL: 35 mg/dL — ABNORMAL LOW (ref >39)
Hematocrit: 42.3 % (ref 37.5–51.0)
Hemoglobin: 13.6 g/dL (ref 13.0–17.7)
Immature Grans (Abs): 0 10*3/uL (ref 0.0–0.1)
Immature Granulocytes: 1 %
Iron: 70 ug/dL (ref 38–169)
LDH: 153 [IU]/L (ref 121–224)
LDL Chol Calc (NIH): 147 mg/dL — ABNORMAL HIGH (ref 0–99)
Lymphocytes Absolute: 1.7 10*3/uL (ref 0.7–3.1)
Lymphs: 30 %
MCH: 28.9 pg (ref 26.6–33.0)
MCHC: 32.2 g/dL (ref 31.5–35.7)
MCV: 90 fL (ref 79–97)
Monocytes Absolute: 0.5 10*3/uL (ref 0.1–0.9)
Monocytes: 9 %
Neutrophils Absolute: 3.1 10*3/uL (ref 1.4–7.0)
Neutrophils: 55 %
Phosphorus: 3.5 mg/dL (ref 2.8–4.1)
Platelets: 190 10*3/uL (ref 150–450)
Potassium: 4.3 mmol/L (ref 3.5–5.2)
RBC: 4.71 x10E6/uL (ref 4.14–5.80)
RDW: 13 % (ref 11.6–15.4)
Sodium: 141 mmol/L (ref 134–144)
T3 Uptake Ratio: 29 % (ref 24–39)
T4, Total: 6 ug/dL (ref 4.5–12.0)
TSH: 2.27 u[IU]/mL (ref 0.450–4.500)
Total Protein: 6.9 g/dL (ref 6.0–8.5)
Triglycerides: 93 mg/dL (ref 0–149)
Uric Acid: 6.8 mg/dL (ref 3.8–8.4)
VLDL Cholesterol Cal: 17 mg/dL (ref 5–40)
WBC: 5.5 10*3/uL (ref 3.4–10.8)
eGFR: 94 mL/min/{1.73_m2} (ref >59)

## 2023-10-14 DIAGNOSIS — F411 Generalized anxiety disorder: Secondary | ICD-10-CM | POA: Diagnosis not present

## 2023-10-30 ENCOUNTER — Other Ambulatory Visit
Admission: RE | Admit: 2023-10-30 | Discharge: 2023-10-30 | Disposition: A | Discharge status: Home / Self Care | Payer: 59 | Attending: Urology | Admitting: Urology

## 2023-10-30 ENCOUNTER — Ambulatory Visit: Payer: 59 | Admitting: Urology

## 2023-10-30 ENCOUNTER — Encounter: Payer: Self-pay | Admitting: Urology

## 2023-10-30 DIAGNOSIS — R7989 Other specified abnormal findings of blood chemistry: Secondary | ICD-10-CM

## 2023-10-30 MED ORDER — CLOMIPHENE CITRATE 50 MG PO TABS: 25.0000 mg | ORAL_TABLET | Freq: Every day | ORAL | 5 refills | Status: DC

## 2023-10-30 NOTE — Progress Notes (Signed)
   10/30/2023 9:04 AM   Travis Cervantes Dec 16, 1991 [id number]  Reason for visit: Follow up low testosterone, fatigue  HPI: 32 year old male who has a very long history of low testosterone as far back as 2015.  He has 2 children, his wife had her tubes tied and they are not interested in further biologic pregnancies.  At our initial visit in April 2024 he reported 4 to 5 years of chronic fatigue.  His vitamin D levels were very low at 16 and he was started on supplementation and testosterone levels improved to 308, 361, and 287.  Ultimately, using shared decision making, he opted for a trial of Clomid 25 mg daily that was started in October 2024.  He reports significant improvement on the Clomid.  He has more energy, decreased libido, and more motivation.  Testosterone level is pending today.  Call with testosterone results Continue Clomid, refilled RTC yearly for testosterone, H/H, CMP  Sondra Come, MD  [facility] [address], [address] [place], [place] [address] [phone]

## 2023-10-31 LAB — TESTOSTERONE: Testosterone: 592 ng/dL (ref 264–916)

## 2023-12-18 DIAGNOSIS — H9203 Otalgia, bilateral: Secondary | ICD-10-CM | POA: Diagnosis not present

## 2023-12-18 DIAGNOSIS — R051 Acute cough: Secondary | ICD-10-CM | POA: Diagnosis not present

## 2023-12-18 DIAGNOSIS — R0981 Nasal congestion: Secondary | ICD-10-CM | POA: Diagnosis not present

## 2023-12-18 DIAGNOSIS — J0140 Acute pansinusitis, unspecified: Secondary | ICD-10-CM | POA: Diagnosis not present

## 2023-12-18 DIAGNOSIS — J3489 Other specified disorders of nose and nasal sinuses: Secondary | ICD-10-CM | POA: Diagnosis not present

## 2023-12-18 DIAGNOSIS — R0982 Postnasal drip: Secondary | ICD-10-CM | POA: Diagnosis not present

## 2023-12-18 NOTE — Progress Notes (Signed)
 Virtual Visit  Subjective: Subjective Patient ID: Travis Cervantes is a 33 y.o. male.  HPI  Started 7 days ago, progressively has gotten worse and now it is moving to my chest. All three people in my house just had walking pneumonia that was treated successfully with antibiotics.  Reports nasal congestion, sinus pain and pressure for about a week. Now has a cough.  Patient confirmed they are in [place] Patient has provided virtual consent for medical evaluation and management. I discussed the limitations of evaluation and management by telemedicine and patient expressed understanding and agreed to proceed: Patient performed self-exam with provider guided assistance.   Respiratory Duration of current symptoms:  7 days Onset quality:  Suddenly Cough Characteristics: nonproductive   Associated symptoms: cough (mild), nasal congestion, ear pain (bilateral pressure and popping), fatigue, headache (SINUS), postnasal drip, sinus pain and sore throat   Associated symptoms: no chest pain, no chest tightness, no chills, no fever, no shortness of breath, no swollen glands and no wheezing   Treatments tried:  Humidifier or steam, increased fluid intake and over-the-counter medication Response to treatment:  Relieved symptoms Relief: mild relief   Special considerations:  None apply  Review of Systems  Constitutional:  Positive for fatigue. Negative for chills and fever.  HENT:  Positive for congestion, ear pain (bilateral pressure and popping), postnasal drip, sinus pressure, sinus pain and sore throat. Negative for ear discharge.   Eyes:  Negative for visual disturbance.  Respiratory:  Positive for cough (mild). Negative for chest tightness, shortness of breath and wheezing.   Cardiovascular: Negative.  Negative for chest pain.  Neurological:  Positive for headaches (SINUS).  Hematological:  Negative for adenopathy.   Social History   Tobacco Use  Smoking Status Never  Smokeless Tobacco Never    Past Medical History:  Diagnosis Date  . Anxiety   . Low testosterone     No past surgical history on file. No family history on file.  Objective: Objective Physical Exam Constitutional:      General: He is not in acute distress.    Appearance: Normal appearance. He is not ill-appearing, toxic-appearing or diaphoretic.  HENT:     Head: Normocephalic and atraumatic.     Right Ear: No drainage or tenderness.     Left Ear: No drainage or tenderness.     Nose: Nasal tenderness and congestion present.     Right Sinus: Maxillary sinus tenderness and frontal sinus tenderness present.     Left Sinus: Maxillary sinus tenderness and frontal sinus tenderness present.     Mouth/Throat:     Lips: Pink. No lesions.     Mouth: Mucous membranes are moist.  Eyes:     General: Lids are normal. Vision grossly intact. Gaze aligned appropriately.     Extraocular Movements: Extraocular movements intact.     Conjunctiva/sclera: Conjunctivae normal.     Pupils: Pupils are equal, round, and reactive to light. Pupils are equal.  Pulmonary:     Effort: Pulmonary effort is normal. No tachypnea, accessory muscle usage, prolonged expiration or respiratory distress.     Comments: Patient able to inspire/expire without coughing, wheezing or respiratory distress. Unlabored breathing noted. Respiratory effort normal. Patient is able to talk in complete sentences. Pt appears to be comfortable at rest and with movement, negative for presence of SOB, visual observation of skin, lip color and tone- wnl, negative for cyanosis. Pt denies chest and back pain with deep breathing.   Unable to physically assess lung sounds due to  visit being conducted virtually.  Musculoskeletal:     Cervical back: Full passive range of motion without pain. No rigidity.  Lymphadenopathy:     Head:     Right side of head: No tonsillar, preauricular or posterior auricular adenopathy.     Left side of head: No tonsillar, preauricular or  posterior auricular adenopathy.     Cervical: No cervical adenopathy.  Skin:    Coloration: Skin is not jaundiced or pale.  Neurological:     General: No focal deficit present.     Mental Status: He is alert and oriented to person, place, and time.  Psychiatric:        Mood and Affect: Mood normal.        Behavior: Behavior normal. Behavior is cooperative.        Thought Content: Thought content normal.        Judgment: Judgment normal.       Assessment/Plan: Assessment HPI provided by Self  Based on today's visit:history and physical exam only, as no relevant testing deemed necessary patient's visit diagnosis is/includes  1. Acute non-recurrent pansinusitis   2. Post-nasal drainage   3. Otalgia of both ears   4. Acute cough   5. Congestion of nasal sinus   6. Sinus pain    Patient has a history of chronic conditions and those listed in the visit diagnoses were reviewed today. They are currently stable on medications.   Plan Treatment plan includes:  Orders Placed: No orders of the defined types were placed in this encounter.  Medications ordered this visit   Signed Prescriptions Disp Refills  . amoxicillin-clavulanate (AUGMENTIN) 875-125 mg tablet 14 tablet 0    Sig: Take 1 tablet by mouth 2 (two) times a day for 7 days  . sodium chloride  (OCEAN) 0.65 % nasal spray      Sig: Instill 1 spray into each nostril 3 (three) times a day as needed for congestion for up to 10 days  . guaiFENesin (MUCINEX) 600 mg Ta12      Sig: Take 600-1,200 mg by mouth 2 (two) times a day as needed (cough) for up to 7 days Max daily dose: 2400mg .    Current medication list and any new medications prescribed or recommended today were reviewed with the patient and specific instructions were provided Yes  Provider Recommendations   As discussed:  Rest and drink plenty of water/fluids (unless your doctor has told you otherwise) Use a humidifier or run a hot shower to create steam 3-4 times  a day for  approximately 10 minutes at a time (This helps lessen congestion)  Use salt water sprays (saline sprays) as directed Apply a warm, moist washcloth to your face 3-4 times a day If you were prescribed an antibiotic finish it all even if you start to feel better Avoid tobacco smoke and other environmental irritants    -Follow up with PCP or Urgent Care if symptoms worsen/fail to improve within 48-72 hours -911/ER for chest pain, shortness of breath/difficulty breathing, cough productive of blood tinged/rust colored mucus, severe headache, neck stiffness, or worsening of symptoms    If you have any questions or concerns about your visit, please contact our virtual care customer support center at [phone] As discussed:  If any of the following symptoms develop, seek immediate medical attention at an emergency room:  ? Temperature over 103.16F ? Severe ear pain without relief  ? Inability to fully open the jaw ? Stiff neck  ? Unable to  drink or hold fluids down   1. Apply warm compresses to your ear. Heat will help decrease the pain.  2. Elevate the head of the bed. This helps reduce ear pressure from building up. Raise the head of the crib for infants or use pillows for older children and adults.  3. Consider using nasal saline spray. For both adults and children, squirt two to four sprays into each nostril 3-4 times a day. This helps thin nasal secretions and reduces ear pressure.  4. Distraction often provides relief. Try playing a game or watching a move to redirect your attention.  5. Hold or rock children that are having ear pain. This helps to soothe and comfort the child.  6. Try over-the-counter pain relievers. Acetaminophen  (Tylenol ) or ibuprofen (Motrin or Advil) are safe medicines to use. Follow the directions printed on the box.    If you have any questions or concerns about your visit, please contact our virtual care customer support center at [phone]  VS:  Wt 255 lb (116 kg) Comment: stated All vital signs are self-reported by the patient. Patient appeared stable throughout visit and showed no signs of distress on video.   Discussed supportive measures  Instructed patient to follow up with PCP, discussed specialist, UC or ER, depending on severity of symptoms- for an in person evaluation if symptoms get worse or do not resolve by the end of treatment. Red flags and plan of care reviewed and discussed with patient. Verbalized understanding and voiced no questions or concerns.    We recommend all patients participate in an in person, annual physical wellness exam.   If you have any questions or concerns about your visit, please contact our virtual care customer support center at [phone].     [facility] virtual visit completed within [facility] guidelines. Provider directed patient of what to do during physical exam to assist in the assessment of the patient.    I have verified the patient's location and am licensed to practice in that state.  Audio and video technology were used to conduct this virtual visit. Patient (or parent/guardian as applicable) consented to virtual care. If patient is a minor, parent/guardian and patient are both present for the duration of the visit    Follow up care instructions were provided and reviewed?with the  Patient. All questions were answered. Patient verbalized understanding of plan of care today.                                                 Visit was performed virtually

## 2023-12-26 DIAGNOSIS — F411 Generalized anxiety disorder: Secondary | ICD-10-CM | POA: Diagnosis not present

## 2024-01-24 ENCOUNTER — Other Ambulatory Visit: Payer: PRIVATE HEALTH INSURANCE

## 2024-01-24 DIAGNOSIS — Z0283 Encounter for blood-alcohol and blood-drug test: Secondary | ICD-10-CM

## 2024-01-24 NOTE — Progress Notes (Signed)
 Presents to [facility] for Random drug screen & breath alcohol screen.  [facility] Acct #:  [id number] [facility] Specimen #:  [id number]  Breath Alcohol Results = 0.000

## 2024-03-11 DIAGNOSIS — F411 Generalized anxiety disorder: Secondary | ICD-10-CM | POA: Diagnosis not present

## 2024-03-16 DIAGNOSIS — M25462 Effusion, left knee: Secondary | ICD-10-CM | POA: Diagnosis not present

## 2024-03-16 DIAGNOSIS — M25562 Pain in left knee: Secondary | ICD-10-CM | POA: Diagnosis not present

## 2024-03-16 DIAGNOSIS — M2392 Unspecified internal derangement of left knee: Secondary | ICD-10-CM | POA: Diagnosis not present

## 2024-03-19 DIAGNOSIS — M25562 Pain in left knee: Secondary | ICD-10-CM | POA: Diagnosis not present

## 2024-03-20 DIAGNOSIS — S86912A Strain of unspecified muscle(s) and tendon(s) at lower leg level, left leg, initial encounter: Secondary | ICD-10-CM | POA: Diagnosis not present

## 2024-03-20 DIAGNOSIS — S82125A Nondisplaced fracture of lateral condyle of left tibia, initial encounter for closed fracture: Secondary | ICD-10-CM | POA: Diagnosis not present

## 2024-04-06 DIAGNOSIS — S82125A Nondisplaced fracture of lateral condyle of left tibia, initial encounter for closed fracture: Secondary | ICD-10-CM | POA: Diagnosis not present

## 2024-04-29 ENCOUNTER — Other Ambulatory Visit: Payer: Self-pay

## 2024-04-29 ENCOUNTER — Other Ambulatory Visit: Payer: Self-pay | Admitting: Physician Assistant

## 2024-04-29 ENCOUNTER — Encounter: Payer: Self-pay | Admitting: Physician Assistant

## 2024-04-29 ENCOUNTER — Ambulatory Visit: Payer: Self-pay | Admitting: Physician Assistant

## 2024-04-29 VITALS — BP 118/82 | HR 86 | Temp 98.2°F | Resp 14

## 2024-04-29 DIAGNOSIS — S30861A Insect bite (nonvenomous) of abdominal wall, initial encounter: Secondary | ICD-10-CM

## 2024-04-29 MED ORDER — DOXYCYCLINE MONOHYDRATE 100 MG PO CAPS: 100.0000 mg | ORAL_CAPSULE | Freq: Every day | ORAL | 0 refills | Status: AC

## 2024-04-29 MED ORDER — DOXYCYCLINE MONOHYDRATE 100 MG PO CAPS: 100.0000 mg | ORAL_CAPSULE | Freq: Two times a day (BID) | ORAL | 0 refills | Status: DC

## 2024-04-29 NOTE — Progress Notes (Signed)
   Subjective: Tick bite    Patient ID: Travis Cervantes, male    DOB: 1991/09/10, 33 y.o.   MRN: [id number]  HPI Patient presents with a complaint right lower abdomen for unknown duration.  Tick was removed today.  States mild edema/erythema.  Denies joint pain, nausea, or fever.   Review of Systems GERD and hyperlipidemia    Objective:   Physical Exam BP 118/82  Pulse Rate 86  Temp 98.2 F (36.8 C)  Resp 14  SpO2 99 %  Palpable lesion erythematous base.  No signs of excoriation secondary infection.       Assessment & Plan: Tick bite.  Labs drawn for Lyme disease and [facility] spotted fever.  Patient given doxycycline  100 mg and will follow-up post lab results.

## 2024-04-29 NOTE — Progress Notes (Signed)
 Here today to evaluate tick bite right lower mid abdomen above belt line.  Stated he removed tick today.  No reported fever or any compaints.  Labs drawn as ordered.

## 2024-04-30 ENCOUNTER — Other Ambulatory Visit: Payer: Self-pay

## 2024-04-30 DIAGNOSIS — Z0283 Encounter for blood-alcohol and blood-drug test: Secondary | ICD-10-CM

## 2024-04-30 NOTE — Progress Notes (Signed)
 Presents to [facility] for Safety Sensitive random drug screen & breath alcohol screen.  [facility] Acct #:   [facility] Specimen #:    Specimen will be picked up this afternoon by [employer].  Breath Alcohol Results - 0.000

## 2024-05-01 LAB — SPOTTED FEVER GROUP ANTIBODIES
Spotted Fever Group IgG: <1:64 {titer}
Spotted Fever Group IgM: <1:64 {titer}

## 2024-05-01 LAB — LYME DISEASE SEROLOGY W/REFLEX: Lyme Total Antibody EIA: NEGATIVE

## 2024-06-09 DIAGNOSIS — F411 Generalized anxiety disorder: Secondary | ICD-10-CM | POA: Diagnosis not present

## 2024-07-14 ENCOUNTER — Encounter: Payer: Self-pay | Admitting: Urology

## 2024-07-19 NOTE — Progress Notes (Unsigned)
 07/21/2024 9:07 PM   Travis Cervantes Nov 08, 1991 [id number]  Referring provider: No referring provider defined for this encounter.  Urological history: 1. Hypogonadism - Clomid  50 mg, 1/2 tablet daily   No chief complaint on file.  HPI: Travis Cervantes is a 33 y.o. man who presents today for discussion regarding his visual disturbances with Clomid .    Previous records reviewed.    Lab Results  Component Value Date   TESTOSTERONE  592 10/30/2023    Lab Results  Component Value Date   HGBA1C 5.5 09/24/2023    PMH: Past Medical History:  Diagnosis Date   Low serum testosterone  level     Surgical History: Past Surgical History:  Procedure Laterality Date   HERNIA REPAIR Right inguinal   distant   LAPAROSCOPIC APPENDECTOMY N/A 05/26/2014   Procedure: APPENDECTOMY LAPAROSCOPIC;  Surgeon: Krystal JINNY Russell, MD;  Location: [facility];  Service: General;  Laterality: N/A;    Home Medications:  Allergies as of 07/21/2024   No Known Allergies      Medication List        Accurate as of July 19, 2024  9:07 PM. If you have any questions, ask your nurse or doctor.          buPROPion 300 MG 24 hr tablet Commonly known as: WELLBUTRIN XL Take 300 mg by mouth daily.   clomiPHENE  50 MG tablet Commonly known as: CLOMID  Take 0.5 tablets (25 mg total) by mouth daily. What changed: additional instructions   escitalopram 10 MG tablet Commonly known as: LEXAPRO Take 10 mg by mouth daily.   omeprazole 40 MG capsule Commonly known as: PRILOSEC Take 40 mg by mouth daily.        Allergies: No Known Allergies  Family History: No family history on file.  Social History: See HPI for pertinent social history  ROS: Pertinent ROS in HPI  Physical Exam: There were no vitals taken for this visit.  Constitutional:  Well nourished. Alert and oriented, No acute distress. HEENT: [place] AT, moist mucus membranes.  Trachea midline, no masses. Cardiovascular: No clubbing,  cyanosis, or edema. Respiratory: Normal respiratory effort, no increased work of breathing. GI: Abdomen is soft, non tender, non distended, no abdominal masses. Liver and spleen not palpable.  No hernias appreciated.  Stool sample for occult testing is not indicated.   GU: No CVA tenderness.  No bladder fullness or masses.  Patient with circumcised/uncircumcised phallus. ***Foreskin easily retracted***  Urethral meatus is patent.  No penile discharge. No penile lesions or rashes. Scrotum without lesions, cysts, rashes and/or edema.  Testicles are located scrotally bilaterally. No masses are appreciated in the testicles. Left and right epididymis are normal. Rectal: Patient with  normal sphincter tone. Anus and perineum without scarring or rashes. No rectal masses are appreciated. Prostate is approximately *** grams, *** nodules are appreciated. Seminal vesicles are normal. Skin: No rashes, bruises or suspicious lesions. Lymph: No cervical or inguinal adenopathy. Neurologic: Grossly intact, no focal deficits, moving all 4 extremities. Psychiatric: Normal mood and affect.  Laboratory Data: See EPIC and HPI  I have reviewed the labs.   Pertinent Imaging: N/A  Assessment & Plan:  ***  1. Visual disturbance - likely a side effect of the Clomid   - will check a prolactin today to rule out pituitary tumor  - discontinue Clomid   - advised that he is no longer a candidate for Clomid  as further exposure to the medication can result in permenant visual changes  2.  Hypogonadism  - discussed potential side effects of testosterone  replacement  including stimulation of erythrocytosis; edema; gynecomastia; worsening sleep apnea; venous thromboembolism; testicular atrophy and infertility.   The theoretical risk of growth stimulation of an undetected prostate cancer was also discussed.  He was informed that current evidence does not provide any definitive answers regarding the risks of testosterone  therapy  on prostate cancer and cardiovascular disease. The need for periodic monitoring of his testosterone  level, PSA, hematocrit and DRE was discussed.  This monitoring will be conducted every three months during the first year of TRT and then every 6 months if blood work remains stable, if there is an abnormality found in follow up blood work, it will result in the monitoring of blood work more frequently  - advised that any missed or delayed appointments will also result in delays in the refilling of the TRT as it is a controlled substance - advised that the office requires one week to refill TRT - Significant symptoms***feeling depressed or tired, having little to no interest in sex, low energy and weak muscles or bones - Recommend starting TRT*** - We discussed the most common forms of replacement including intramuscular injection and gels and he desires to start injections*** - Rx testosterone  cypionate-200 mg every 2 weeks to start*** - Appointment will be made for injection training*** - Follow-up 5 weeks after starting TRT for testosterone  level and symptom check*** - discussed that we follow guidelines for age appropriate testosterone  levels to decide on dosing regimens for TRT and this is based on current agreements in the medical community and we will not deviate from this unless there is good scientific data to do so [employer] may not recognize the parameters we use to determine that the patient has low testosterone  and therefore, if they want to pursue TRT, it will have be out-of-pocket    20-39 years [phone]) 40-49 ([phone]) (5.3-26.3) 50-59 ([phone]) (4.2-22.2) 60-69 (196-859) (3.7-18.9) 70-     ([phone]) (2.2-14.7)        No follow-ups on file.  These notes generated with voice recognition software. I apologize for typographical errors.  Travis Cervantes  [facility] [address]  [address] [place], [place] [address] [phone]

## 2024-07-21 ENCOUNTER — Ambulatory Visit (INDEPENDENT_AMBULATORY_CARE_PROVIDER_SITE_OTHER): Admitting: Urology

## 2024-07-21 ENCOUNTER — Encounter: Payer: Self-pay | Admitting: Urology

## 2024-07-21 VITALS — BP 120/77 | HR 88 | Ht 71.0 in | Wt 250.0 lb

## 2024-07-21 DIAGNOSIS — H539 Unspecified visual disturbance: Secondary | ICD-10-CM | POA: Diagnosis not present

## 2024-07-21 DIAGNOSIS — E291 Testicular hypofunction: Secondary | ICD-10-CM

## 2024-07-21 MED ORDER — BD DISP NEEDLES 18G X 1-1/2" MISC: 1.0000 mg | 0 refills | Status: AC

## 2024-07-21 MED ORDER — TESTOSTERONE CYPIONATE 200 MG/ML IM SOLN
200.0000 mg | INTRAMUSCULAR | 0 refills | Status: DC
Start: 2024-07-21 — End: 2024-09-24

## 2024-07-21 MED ORDER — SYRINGE 2-3 ML 3 ML MISC: 1.0000 mg | 3 refills | Status: AC

## 2024-07-21 MED ORDER — BD DISP NEEDLES 21G X 1-1/2" MISC: 1.0000 mg | 0 refills | Status: AC

## 2024-07-22 ENCOUNTER — Ambulatory Visit: Payer: Self-pay | Admitting: Urology

## 2024-07-22 LAB — PROLACTIN: Prolactin: 18.5 ng/mL (ref 3.9–22.7)

## 2024-08-02 DIAGNOSIS — Z713 Dietary counseling and surveillance: Secondary | ICD-10-CM | POA: Diagnosis not present

## 2024-08-02 DIAGNOSIS — E669 Obesity, unspecified: Secondary | ICD-10-CM | POA: Diagnosis not present

## 2024-08-04 ENCOUNTER — Ambulatory Visit: Admitting: Urology

## 2024-08-09 DIAGNOSIS — E669 Obesity, unspecified: Secondary | ICD-10-CM | POA: Diagnosis not present

## 2024-08-09 DIAGNOSIS — Z713 Dietary counseling and surveillance: Secondary | ICD-10-CM | POA: Diagnosis not present

## 2024-09-03 DIAGNOSIS — F411 Generalized anxiety disorder: Secondary | ICD-10-CM | POA: Diagnosis not present

## 2024-09-23 ENCOUNTER — Other Ambulatory Visit: Payer: Self-pay

## 2024-09-23 ENCOUNTER — Other Ambulatory Visit

## 2024-09-23 DIAGNOSIS — R7989 Other specified abnormal findings of blood chemistry: Secondary | ICD-10-CM | POA: Diagnosis not present

## 2024-09-23 DIAGNOSIS — E291 Testicular hypofunction: Secondary | ICD-10-CM

## 2024-09-24 ENCOUNTER — Other Ambulatory Visit: Payer: Self-pay | Admitting: Urology

## 2024-09-24 ENCOUNTER — Ambulatory Visit: Payer: Self-pay | Admitting: Urology

## 2024-09-24 DIAGNOSIS — E291 Testicular hypofunction: Secondary | ICD-10-CM

## 2024-09-24 LAB — HEMOGLOBIN AND HEMATOCRIT, BLOOD
Hematocrit: 47.7 % (ref 37.5–51.0)
Hemoglobin: 15.3 g/dL (ref 13.0–17.7)

## 2024-09-24 LAB — TESTOSTERONE: Testosterone: 287 ng/dL (ref 264–916)

## 2024-09-24 MED ORDER — TESTOSTERONE CYPIONATE 200 MG/ML IM SOLN: INTRAMUSCULAR | 0 refills | Status: DC

## 2024-09-28 NOTE — Telephone Encounter (Signed)
 Called and spoke to Angie at [facility] she is working on getting it filled

## 2024-10-28 ENCOUNTER — Ambulatory Visit: Payer: Self-pay | Admitting: Urology

## 2024-10-29 ENCOUNTER — Ambulatory Visit: Admitting: Urology

## 2024-10-29 ENCOUNTER — Encounter: Payer: Self-pay | Admitting: Urology

## 2024-11-09 ENCOUNTER — Ambulatory Visit: Payer: PRIVATE HEALTH INSURANCE

## 2024-11-09 DIAGNOSIS — Z0000 Encounter for general adult medical examination without abnormal findings: Secondary | ICD-10-CM

## 2024-11-09 LAB — POCT URINALYSIS DIPSTICK
Bilirubin, UA: NEGATIVE
Blood, UA: NEGATIVE
Glucose, UA: NEGATIVE
Ketones, UA: NEGATIVE
Leukocytes, UA: NEGATIVE
Nitrite, UA: NEGATIVE
Protein, UA: POSITIVE — AB
Spec Grav, UA: 1.01 (ref 1.010–1.025)
Urobilinogen, UA: 0.2 U/dL
pH, UA: 7.5 (ref 5.0–8.0)

## 2024-11-10 LAB — CMP12+LP+TP+TSH+6AC+CBC/D/PLT
ALT: 25 IU/L (ref 0–44)
AST: 27 IU/L (ref 0–40)
Albumin: 4.4 g/dL (ref 4.1–5.1)
Alkaline Phosphatase: 65 IU/L (ref 47–123)
BUN/Creatinine Ratio: 9 (ref 9–20)
BUN: 8 mg/dL (ref 6–20)
Basophils Absolute: 0 x10E3/uL (ref 0.0–0.2)
Basos: 0 %
Bilirubin Total: 0.6 mg/dL (ref 0.0–1.2)
Calcium: 9.6 mg/dL (ref 8.7–10.2)
Chloride: 100 mmol/L (ref 96–106)
Chol/HDL Ratio: 7.2 ratio — ABNORMAL HIGH (ref 0.0–5.0)
Cholesterol, Total: 238 mg/dL — ABNORMAL HIGH (ref 100–199)
Creatinine, Ser: 0.91 mg/dL (ref 0.76–1.27)
EOS (ABSOLUTE): 0.2 x10E3/uL (ref 0.0–0.4)
Eos: 5 %
Estimated CHD Risk: 1.5 times avg. — ABNORMAL HIGH (ref 0.0–1.0)
Free Thyroxine Index: 1.1 — ABNORMAL LOW (ref 1.2–4.9)
GGT: 24 IU/L (ref 0–65)
Globulin, Total: 2.4 g/dL (ref 1.5–4.5)
Glucose: 100 mg/dL — ABNORMAL HIGH (ref 70–99)
HDL: 33 mg/dL — ABNORMAL LOW (ref >39)
Hematocrit: 48.6 % (ref 37.5–51.0)
Hemoglobin: 15.5 g/dL (ref 13.0–17.7)
Immature Grans (Abs): 0 x10E3/uL (ref 0.0–0.1)
Immature Granulocytes: 0 %
Iron: 80 ug/dL (ref 38–169)
LDH: 159 IU/L (ref 121–224)
LDL Chol Calc (NIH): 163 mg/dL — ABNORMAL HIGH (ref 0–99)
Lymphocytes Absolute: 1.5 x10E3/uL (ref 0.7–3.1)
Lymphs: 32 %
MCH: 27.9 pg (ref 26.6–33.0)
MCHC: 31.9 g/dL (ref 31.5–35.7)
MCV: 88 fL (ref 79–97)
Monocytes Absolute: 0.4 x10E3/uL (ref 0.1–0.9)
Monocytes: 9 %
Neutrophils Absolute: 2.5 x10E3/uL (ref 1.4–7.0)
Neutrophils: 54 %
Phosphorus: 3.1 mg/dL (ref 2.8–4.1)
Platelets: 210 x10E3/uL (ref 150–450)
Potassium: 4.4 mmol/L (ref 3.5–5.2)
RBC: 5.55 x10E6/uL (ref 4.14–5.80)
RDW: 12.7 % (ref 11.6–15.4)
Sodium: 139 mmol/L (ref 134–144)
T3 Uptake Ratio: 27 % (ref 24–39)
T4, Total: 3.9 ug/dL — ABNORMAL LOW (ref 4.5–12.0)
TSH: 1.89 u[IU]/mL (ref 0.450–4.500)
Total Protein: 6.8 g/dL (ref 6.0–8.5)
Triglycerides: 224 mg/dL — ABNORMAL HIGH (ref 0–149)
Uric Acid: 6.3 mg/dL (ref 3.8–8.4)
VLDL Cholesterol Cal: 42 mg/dL — ABNORMAL HIGH (ref 5–40)
WBC: 4.6 x10E3/uL (ref 3.4–10.8)
eGFR: 116 mL/min/1.73 (ref >59)

## 2024-11-17 ENCOUNTER — Encounter: Payer: Self-pay | Admitting: Physician Assistant

## 2024-11-17 ENCOUNTER — Ambulatory Visit: Payer: Self-pay | Admitting: Physician Assistant

## 2024-11-17 VITALS — BP 121/81 | HR 70 | Temp 97.5°F | Resp 14 | Wt 252.0 lb

## 2024-11-17 DIAGNOSIS — R801 Persistent proteinuria, unspecified: Secondary | ICD-10-CM

## 2024-11-17 DIAGNOSIS — Z0000 Encounter for general adult medical examination without abnormal findings: Secondary | ICD-10-CM

## 2024-11-17 LAB — POCT URINALYSIS DIPSTICK
Bilirubin, UA: NEGATIVE
Blood, UA: NEGATIVE
Glucose, UA: NEGATIVE
Leukocytes, UA: NEGATIVE
Nitrite, UA: NEGATIVE
Protein, UA: NEGATIVE
Spec Grav, UA: <=1.005 — AB (ref 1.010–1.025)
Urobilinogen, UA: 0.2 U/dL
pH, UA: 6.5 (ref 5.0–8.0)

## 2024-11-17 MED ORDER — ROSUVASTATIN CALCIUM 40 MG PO TABS: 40.0000 mg | ORAL_TABLET | Freq: Every day | ORAL | 3 refills | Status: AC

## 2024-11-17 NOTE — Progress Notes (Signed)
 Here for annual physical and works [employer] and voices no complaints.  He is looking for a PCP at this time.

## 2024-11-17 NOTE — Addendum Note (Signed)
 Addended by: ELUTERIO JENKINS HERO on: 11/17/2024 10:33 AM   Modules accepted: Orders

## 2024-11-17 NOTE — Progress Notes (Signed)
 [facility] [place] [facility]  ____________________________________________   None    (approximate)  I have reviewed the triage vital signs and the nursing notes.   HISTORY  Chief Complaint Annual Exam   HPI Travis Cervantes is a 33 y.o. male patient presents for annual physical exam for the [facility] [employer]         Past Medical History:  Diagnosis Date   Low serum testosterone  level     Patient Active Problem List   Diagnosis Date Noted   GERD (gastroesophageal reflux disease) 04/05/2020   Anxiety 04/05/2020   Elevated lipids 04/05/2020   Post concussion syndrome 10/08/2018   Acute appendicitis 05/26/2014   Routine health maintenance 11/26/2013    Past Surgical History:  Procedure Laterality Date   HERNIA REPAIR Right inguinal   distant   LAPAROSCOPIC APPENDECTOMY N/A 05/26/2014   Procedure: APPENDECTOMY LAPAROSCOPIC;  Surgeon: Krystal JINNY Russell, MD;  Location: [facility];  Service: General;  Laterality: N/A;    Prior to Admission medications   Medication Sig Start Date End Date Taking? Authorizing Provider  escitalopram (LEXAPRO) 10 MG tablet Take 10 mg by mouth daily. Patient taking differently: Take 5 mg by mouth every other day. Stated he is weaning himself off of it 10/14/23  Yes [provider]  omeprazole (PRILOSEC) 40 MG capsule Take 40 mg by mouth daily. 07/11/23  Yes [provider]  testosterone  cypionate (DEPOTESTOSTERONE CYPIONATE) 200 MG/ML injection Inject 1 cc every 7 days 09/24/24  Yes McGowan, Clotilda A, PA-C  NEEDLE, DISP, 18 G (BD DISP NEEDLES) 18G X 1-1/2 MISC 1 mg by Does not apply route every 14 (fourteen) days. 07/21/24   Helon Clotilda A, PA-C  NEEDLE, DISP, 21 G (BD DISP NEEDLES) 21G X 1-1/2 MISC 1 mg by Does not apply route every 14 (fourteen) days. 07/21/24   Helon Clotilda A, PA-C  Syringe, Disposable, (2-3CC SYRINGE) 3 ML MISC 1 mg by Does not apply route every 14 (fourteen) days.  07/21/24   Helon Clotilda LABOR, PA-C    Allergies Clomid  [clomiphene ]  No family history on file.  Social History Social History   Tobacco Use   Smoking status: Former    Current packs/day: 1.00    Types: Cigarettes    Passive exposure: Past   Smokeless tobacco: Current    Types: Snuff  Vaping Use   Vaping status: Every Day  Substance Use Topics   Alcohol use: Yes    Comment: occ   Drug use: No    Review of Systems  Constitutional: No fever/chills Eyes: No visual changes. ENT: No sore throat. Cardiovascular: Denies chest pain. Respiratory: Denies shortness of breath. Gastrointestinal: No abdominal pain.  No nausea, no vomiting.  No diarrhea.  No constipation. Genitourinary: Negative for dysuria.  Persistent proteinuria Musculoskeletal: Negative for back pain. Skin: Negative for rash. Neurological: Negative for headaches, focal weakness or numbness. Psychiatric: Anxiety Endocrine: Hyperlipidemia and low testosterone  Hematological/Lymphatic:  Allergic/Immunilogical:clomid  ____________________________________________   PHYSICAL EXAM:  VITAL SIGNS: BP 121/81  Pulse Rate 70  Temp 97.5 F (36.4 C)  Weight 252 lb (114.3 kg)  Resp 14  SpO2 96 %   BMI: 35.15 kg/m2  BSA: 2.39 m2   Constitutional: Alert and oriented. Well appearing and in no acute distress. Eyes: Conjunctivae are normal. PERRL. EOMI. Head: Atraumatic. Nose: No congestion/rhinnorhea. Mouth/Throat: Mucous membranes are moist.  Oropharynx non-erythematous. Neck: No stridor. No cervical spine tenderness to palpation. Hematological/Lymphatic/Immunilogical: No cervical lymphadenopathy. Cardiovascular: Normal rate,  regular rhythm. Grossly normal heart sounds.  Good peripheral circulation. Respiratory: Normal respiratory effort.  No retractions. Lungs CTAB. Gastrointestinal: Soft and nontender. No distention. No abdominal bruits. No CVA tenderness. Genitourinary: Deferred Musculoskeletal: No lower  extremity tenderness nor edema.  No joint effusions. Neurologic:  Normal speech and language. No gross focal neurologic deficits are appreciated. No gait instability. Skin:  Skin is warm, dry and intact. No rash noted. Psychiatric: Mood and affect are normal. Speech and behavior are normal.  ____________________________________________   LABS         Component Ref Range & Units (hover) 8 d ago (11/09/24) 1 yr ago (09/24/23) 2 yr ago (11/05/22) 10 yr ago (05/26/14) 10 yr ago (04/18/14)  Color, UA dark yellow Yellow Amber    Clarity, UA clear Clear Clear    Glucose, UA Negative Negative Negative    Bilirubin, UA neg Negative Negative    Ketones, UA neg Negative Negative    Spec Grav, UA 1.010 1.015 1.025    Blood, UA neg Negative Negative    pH, UA 7.5 7.0 6.0    Protein, UA Positive Abnormal  Positive Abnormal  CM Positive Abnormal  CM    Comment: trace -+  Urobilinogen, UA 0.2 0.2 0.2 0.2 R 0.2 R  Nitrite, UA neg Negative Negative    Leukocytes, UA Negative Negative Negative NEGATIVE R, CM NEGATIVE R, CM  Appearance    CLEAR R CLEAR R  Odor                          Component Ref Range & Units (hover) 8 d ago (11/09/24) 1 mo ago (09/23/24) 1 yr ago (10/09/23) 1 yr ago (09/24/23) 1 yr ago (07/07/23) 1 yr ago (07/07/23) 1 yr ago (03/05/23) 1 yr ago (03/05/23)  Glucose 100 High   95 125 High  169 High  CM     Uric Acid 6.3  6.8 CM 6.8 CM      Comment:            Therapeutic target for gout patients: <6.0  BUN 8  14 17 16      Creatinine, Ser 0.91  1.09 0.98 1.04 R     eGFR 116  94 106      BUN/Creatinine Ratio 9  13 17       Sodium 139  141 141 138 R     Potassium 4.4  4.3 4.2 3.4 Low  R     Chloride 100  103 103 107 R     Calcium 9.6  9.0 9.5 9.2 R     Phosphorus 3.1  3.5 3.1      Total Protein 6.8  6.9 7.2      Albumin 4.4  4.5 R 4.7 R      Globulin, Total 2.4  2.4 2.5      Bilirubin Total 0.6  0.4 0.6      Alkaline Phosphatase 65  54 R 61 R      LDH 159  153 151       AST 27  19 31       ALT 25  18 28       GGT 24  18 19       Iron 80  70 106      Cholesterol, Total 238 High   199 250 High    227 High    Triglycerides 224 High   93 118   91   HDL  33 Low   35 Low  41   39 Low    VLDL Cholesterol Cal 42 High   17 21   16    LDL Chol Calc (NIH) 163 High   147 High  188 High    172 High    Chol/HDL Ratio 7.2 High   5.7 High  CM 6.1 High  CM   5.8 High  CM   Comment:                                   T. Chol/HDL Ratio                                             Men  Women                               1/2 Avg.Risk  3.4    3.3                                   Avg.Risk  5.0    4.4                                2X Avg.Risk  9.6    7.1                                3X Avg.Risk 23.4   11.0  Estimated CHD Risk 1.5 High   1.2 High  CM 1.3 High  CM      Comment: The CHD Risk is based on the T. Chol/HDL ratio. Other factors affect CHD Risk such as hypertension, smoking, diabetes, severe obesity, and family history of premature CHD.  TSH 1.890  2.270 1.780    1.780  T4, Total 3.9 Low   6.0 6.4      T3 Uptake Ratio 27  29 30       Free Thyroxine Index 1.1 Low   1.7 1.9      WBC 4.6  5.5 4.5  7.0 R    RBC 5.55  4.71 4.73  4.96 R    Hemoglobin 15.5 15.3 13.6 13.9  14.6 R    Hematocrit 48.6 47.7 42.3 42.1  41.0 R    MCV 88  90 89  82.7 R    MCH 27.9  28.9 29.4  29.4 R    MCHC 31.9  32.2 33.0  35.6 R    RDW 12.7  13.0 12.7  12.9 R    Platelets 210  190 194  233 R    Neutrophils 54  55 53      Lymphs 32  30 33      Monocytes 9  9 8       Eos 5  4 5       Basos 0  1 1      Neutrophils Absolute 2.5  3.1 2.4      Lymphocytes Absolute 1.5  1.7 1.5      Monocytes Absolute 0.4  0.5 0.4      EOS (ABSOLUTE) 0.2  0.2 0.2  Basophils Absolute 0.0  0.0 0.0      Immature Granulocytes 0  1 0      Immature Grans (Abs) 0.0  0.0 0.0                     ____________________________________________  EKG  Sinus rhythm at 61  bpm ____________________________________________    ____________________________________________   INITIAL IMPRESSION / ASSESSMENT AND PLAN As part of my medical decision making, I reviewed the following data within the electronic MEDICAL RECORD NUMBER      No acute findings on physical exam or EKG.  Lab consistently shows persistent proteinuria.  There is a close family history of kidney cancer.  Patient also has mixed hyperlipidemia.  Patient will be consulted with nephrology for definitive evaluation and treatment.  Patient will start Crestor and follow-up in 3 months.        ____________________________________________   FINAL CLINICAL IMPRESSION Annual physical exam.  Persistent proteinuria and hyperlipidemia.   ED Discharge Orders     None        Note:  This document was prepared using Dragon voice recognition software and may include unintentional dictation errors.

## 2024-11-17 NOTE — Progress Notes (Signed)
 Repeat UA completed.  Stated this is day 5 of only eating once a day at evening meal and very low calories.

## 2024-11-17 NOTE — Addendum Note (Signed)
 Addended by: GENIA MINI on: 11/17/2024 01:02 PM   Modules accepted: Orders

## 2024-11-24 ENCOUNTER — Ambulatory Visit (INDEPENDENT_AMBULATORY_CARE_PROVIDER_SITE_OTHER): Admitting: Urology

## 2024-11-24 ENCOUNTER — Encounter: Payer: Self-pay | Admitting: Urology

## 2024-11-24 VITALS — BP 134/82 | HR 86 | Ht 71.0 in | Wt 249.0 lb

## 2024-11-24 DIAGNOSIS — R801 Persistent proteinuria, unspecified: Secondary | ICD-10-CM | POA: Diagnosis not present

## 2024-11-24 DIAGNOSIS — E291 Testicular hypofunction: Secondary | ICD-10-CM

## 2024-11-24 NOTE — Progress Notes (Signed)
   11/24/2024 1:49 PM   NYCHOLAS RAYNER 1991-01-08 [id number]  Reason for visit: Follow up hypogonadism, proteinuria  History: Long history of hypogonadism as far back as 2015 with levels below 300 Does not desire further biologic pregnancies, wife has had tubes tied, they have 2 children Started on Clomid  25 mg daily October 2024 and initially had excellent response Developed vision changes on Clomid  in August 2025 and was changed to injections by Clotilda Cornwall, PA  Physical Exam: BP 134/82 (BP Location: Left Arm, Patient Position: Sitting, Cuff Size: Large)   Pulse 86   Ht 5' 11 (1.803 m)   Wt 249 lb (112.9 kg)   SpO2 98%   BMI 34.73 kg/m    Imaging/labs: October 2025 testosterone  287, hematocrit 47 Creatinine October 2024 normal at 0.98, eGFR greater than 60  Today: Doing well on 200 mg weekly testosterone  cypionate, hypogonadism symptoms resolved, no side effects, wife giving his injections Had questions today about proteinuria and PCP recommended referral to nephrology, denies any urinary symptoms  Plan:   Hypogonadism: Continue testosterone  cypionate 200 mg weekly, will recheck labs at 6 months and see in person yearly.  Testosterone  refilled Proteinuria: Reassurance provided regarding normal kidney function, agree with evaluation by nephrology for proteinuria with his young age RTC 6 months testosterone , H/H lab visit, call with results, RTC in person yearly   Redell JAYSON Burnet, MD  [facility] [address], [address] [place], [place] [address] [phone]

## 2024-12-21 DIAGNOSIS — E291 Testicular hypofunction: Secondary | ICD-10-CM

## 2024-12-24 ENCOUNTER — Other Ambulatory Visit: Payer: Self-pay

## 2024-12-24 ENCOUNTER — Other Ambulatory Visit: Payer: Self-pay | Admitting: Urology

## 2024-12-24 ENCOUNTER — Telehealth: Payer: Self-pay

## 2024-12-24 DIAGNOSIS — E291 Testicular hypofunction: Secondary | ICD-10-CM

## 2024-12-24 MED ORDER — TESTOSTERONE CYPIONATE 200 MG/ML IM SOLN: INTRAMUSCULAR | 0 refills | Status: AC

## 2024-12-24 NOTE — Telephone Encounter (Signed)
 Patient scheduled.

## 2024-12-24 NOTE — Telephone Encounter (Signed)
 Left vm message to call office in regards to setting up a lab appointment for testosterone  and H&H on Wednesday or Thursday late January early February. Mychart message was also sent.  Andrea Kirks LPN

## 2025-01-13 ENCOUNTER — Other Ambulatory Visit

## 2025-01-13 DIAGNOSIS — E291 Testicular hypofunction: Secondary | ICD-10-CM

## 2025-01-14 ENCOUNTER — Ambulatory Visit: Payer: Self-pay | Admitting: Urology

## 2025-01-14 LAB — HEMOGLOBIN AND HEMATOCRIT, BLOOD
Hematocrit: 47.3 % (ref 37.5–51.0)
Hemoglobin: 15.5 g/dL (ref 13.0–17.7)

## 2025-01-14 LAB — TESTOSTERONE: Testosterone: 1133 ng/dL — ABNORMAL HIGH (ref 264–916)

## 2025-05-26 ENCOUNTER — Other Ambulatory Visit

## 2025-11-24 ENCOUNTER — Ambulatory Visit: Admitting: Urology
